# Patient Record
Sex: Female | Born: 1980 | Race: White | Hispanic: No | Marital: Married | Smoking: Current every day smoker
Health system: Southern US, Community
[De-identification: ages and names within clinical notes are randomized; demographics above are authoritative.]

## PROBLEM LIST (undated history)

## (undated) DIAGNOSIS — G43909 Migraine, unspecified, not intractable, without status migrainosus: Secondary | ICD-10-CM

## (undated) DIAGNOSIS — R569 Unspecified convulsions: Secondary | ICD-10-CM

## (undated) HISTORY — PX: ABDOMINAL HYSTERECTOMY: SHX81

---

## 1998-05-19 ENCOUNTER — Inpatient Hospital Stay (HOSPITAL_COMMUNITY): Admission: AD | Admit: 1998-05-19 | Discharge: 1998-05-19 | Payer: Self-pay | Admitting: Obstetrics and Gynecology

## 1998-05-28 ENCOUNTER — Ambulatory Visit (HOSPITAL_COMMUNITY): Admission: RE | Admit: 1998-05-28 | Discharge: 1998-05-28 | Payer: Self-pay | Admitting: Obstetrics & Gynecology

## 1998-11-03 ENCOUNTER — Encounter: Payer: Self-pay | Admitting: Family Medicine

## 1998-11-03 ENCOUNTER — Ambulatory Visit (HOSPITAL_COMMUNITY): Admission: RE | Admit: 1998-11-03 | Discharge: 1998-11-03 | Payer: Self-pay | Admitting: Family Medicine

## 1998-11-05 ENCOUNTER — Other Ambulatory Visit: Admission: RE | Admit: 1998-11-05 | Discharge: 1998-11-05 | Payer: Self-pay | Admitting: Family Medicine

## 1999-02-01 ENCOUNTER — Inpatient Hospital Stay (HOSPITAL_COMMUNITY): Admission: AD | Admit: 1999-02-01 | Discharge: 1999-02-03 | Payer: Self-pay | Admitting: Family Medicine

## 1999-02-02 ENCOUNTER — Encounter: Payer: Self-pay | Admitting: Family Medicine

## 1999-03-29 ENCOUNTER — Inpatient Hospital Stay (HOSPITAL_COMMUNITY): Admission: AD | Admit: 1999-03-29 | Discharge: 1999-03-30 | Payer: Self-pay | Admitting: Family Medicine

## 2000-05-13 ENCOUNTER — Emergency Department (HOSPITAL_COMMUNITY): Admission: EM | Admit: 2000-05-13 | Discharge: 2000-05-13 | Payer: Self-pay | Admitting: Emergency Medicine

## 2000-06-08 ENCOUNTER — Other Ambulatory Visit: Admission: RE | Admit: 2000-06-08 | Discharge: 2000-06-08 | Payer: Self-pay | Admitting: Family Medicine

## 2000-06-14 ENCOUNTER — Ambulatory Visit (HOSPITAL_COMMUNITY): Admission: RE | Admit: 2000-06-14 | Discharge: 2000-06-14 | Payer: Self-pay | Admitting: Family Medicine

## 2000-06-14 ENCOUNTER — Encounter: Payer: Self-pay | Admitting: Family Medicine

## 2000-07-31 ENCOUNTER — Ambulatory Visit (HOSPITAL_COMMUNITY): Admission: RE | Admit: 2000-07-31 | Discharge: 2000-07-31 | Payer: Self-pay | Admitting: Family Medicine

## 2000-07-31 ENCOUNTER — Encounter: Payer: Self-pay | Admitting: Family Medicine

## 2000-10-10 ENCOUNTER — Encounter: Payer: Self-pay | Admitting: Family Medicine

## 2000-10-10 ENCOUNTER — Ambulatory Visit (HOSPITAL_COMMUNITY): Admission: RE | Admit: 2000-10-10 | Discharge: 2000-10-10 | Payer: Self-pay | Admitting: Family Medicine

## 2000-10-23 ENCOUNTER — Inpatient Hospital Stay (HOSPITAL_COMMUNITY): Admission: AD | Admit: 2000-10-23 | Discharge: 2000-10-26 | Payer: Self-pay | Admitting: Family Medicine

## 2000-10-24 ENCOUNTER — Encounter: Payer: Self-pay | Admitting: Family Medicine

## 2000-10-31 ENCOUNTER — Other Ambulatory Visit: Admission: RE | Admit: 2000-10-31 | Discharge: 2000-10-31 | Payer: Self-pay | Admitting: Family Medicine

## 2000-12-02 ENCOUNTER — Inpatient Hospital Stay (HOSPITAL_COMMUNITY): Admission: AD | Admit: 2000-12-02 | Discharge: 2000-12-02 | Payer: Self-pay | Admitting: Family Medicine

## 2000-12-21 ENCOUNTER — Inpatient Hospital Stay (HOSPITAL_COMMUNITY): Admission: AD | Admit: 2000-12-21 | Discharge: 2000-12-21 | Payer: Self-pay | Admitting: *Deleted

## 2000-12-26 ENCOUNTER — Inpatient Hospital Stay (HOSPITAL_COMMUNITY): Admission: AD | Admit: 2000-12-26 | Discharge: 2000-12-27 | Payer: Self-pay | Admitting: *Deleted

## 2001-11-08 ENCOUNTER — Other Ambulatory Visit: Admission: RE | Admit: 2001-11-08 | Discharge: 2001-11-08 | Payer: Self-pay | Admitting: Family Medicine

## 2002-11-01 ENCOUNTER — Ambulatory Visit (HOSPITAL_COMMUNITY): Admission: RE | Admit: 2002-11-01 | Discharge: 2002-11-01 | Payer: Self-pay | Admitting: Family Medicine

## 2002-11-28 ENCOUNTER — Other Ambulatory Visit: Admission: RE | Admit: 2002-11-28 | Discharge: 2002-11-28 | Payer: Self-pay | Admitting: Family Medicine

## 2004-12-30 ENCOUNTER — Emergency Department (HOSPITAL_COMMUNITY): Admission: EM | Admit: 2004-12-30 | Discharge: 2004-12-30 | Payer: Self-pay | Admitting: Emergency Medicine

## 2009-02-14 ENCOUNTER — Emergency Department (HOSPITAL_COMMUNITY): Admission: EM | Admit: 2009-02-14 | Discharge: 2009-02-14 | Payer: Self-pay | Admitting: Emergency Medicine

## 2009-02-18 ENCOUNTER — Other Ambulatory Visit: Admission: RE | Admit: 2009-02-18 | Discharge: 2009-02-18 | Payer: Self-pay | Admitting: Obstetrics & Gynecology

## 2009-03-13 ENCOUNTER — Encounter: Admission: RE | Admit: 2009-03-13 | Discharge: 2009-03-13 | Payer: Self-pay | Admitting: *Deleted

## 2009-03-23 ENCOUNTER — Ambulatory Visit (HOSPITAL_COMMUNITY): Admission: RE | Admit: 2009-03-23 | Discharge: 2009-03-24 | Payer: Self-pay | Admitting: Obstetrics & Gynecology

## 2009-03-23 ENCOUNTER — Encounter: Payer: Self-pay | Admitting: Obstetrics & Gynecology

## 2010-09-04 ENCOUNTER — Emergency Department (HOSPITAL_COMMUNITY): Admission: EM | Admit: 2010-09-04 | Discharge: 2010-09-04 | Payer: Self-pay | Admitting: Emergency Medicine

## 2011-03-30 LAB — CBC
HCT: 33.7 % — ABNORMAL LOW (ref 36.0–46.0)
Hemoglobin: 11.4 g/dL — ABNORMAL LOW (ref 12.0–15.0)
MCHC: 33.4 g/dL (ref 30.0–36.0)
MCV: 92.1 fL (ref 78.0–100.0)
RBC: 4.53 MIL/uL (ref 3.87–5.11)
RDW: 13 % (ref 11.5–15.5)
WBC: 13.7 10*3/uL — ABNORMAL HIGH (ref 4.0–10.5)

## 2011-03-30 LAB — PREGNANCY, URINE: Preg Test, Ur: NEGATIVE

## 2011-03-30 LAB — COMPREHENSIVE METABOLIC PANEL
AST: 17 U/L (ref 0–37)
CO2: 20 mEq/L (ref 19–32)
Calcium: 8.9 mg/dL (ref 8.4–10.5)
Creatinine, Ser: 0.88 mg/dL (ref 0.4–1.2)
GFR calc Af Amer: >60 mL/min (ref >60)
GFR calc non Af Amer: >60 mL/min (ref >60)
Total Protein: 6.3 g/dL (ref 6.0–8.3)

## 2011-03-30 LAB — URINALYSIS, ROUTINE W REFLEX MICROSCOPIC
Bilirubin Urine: NEGATIVE
Hgb urine dipstick: NEGATIVE
Ketones, ur: NEGATIVE mg/dL
Protein, ur: NEGATIVE mg/dL
Specific Gravity, Urine: >1.03 — ABNORMAL HIGH (ref 1.005–1.030)
Urobilinogen, UA: 0.2 mg/dL (ref 0.0–1.0)

## 2011-03-30 LAB — TYPE AND SCREEN
ABO/RH(D): O POS
Antibody Screen: NEGATIVE

## 2011-03-30 LAB — BASIC METABOLIC PANEL
GFR calc non Af Amer: >60 mL/min (ref >60)
Glucose, Bld: 144 mg/dL — ABNORMAL HIGH (ref 70–99)
Potassium: 3.8 mEq/L (ref 3.5–5.1)
Sodium: 139 mEq/L (ref 135–145)

## 2011-03-30 LAB — ABO/RH: ABO/RH(D): O POS

## 2011-04-05 LAB — POCT I-STAT, CHEM 8
Calcium, Ion: 1.19 mmol/L (ref 1.12–1.32)
Chloride: 110 mEq/L (ref 96–112)
Glucose, Bld: 98 mg/dL (ref 70–99)
HCT: 42 % (ref 36.0–46.0)
Hemoglobin: 14.3 g/dL (ref 12.0–15.0)

## 2011-04-05 LAB — CBC
HCT: 40.8 % (ref 36.0–46.0)
Hemoglobin: 13.9 g/dL (ref 12.0–15.0)
MCHC: 34.1 g/dL (ref 30.0–36.0)
MCV: 91.5 fL (ref 78.0–100.0)
RDW: 13.4 % (ref 11.5–15.5)

## 2011-04-05 LAB — WET PREP, GENITAL: Trich, Wet Prep: NONE SEEN

## 2011-04-05 LAB — DIFFERENTIAL
Basophils Absolute: 0.1 10*3/uL (ref 0.0–0.1)
Basophils Relative: 1 % (ref 0–1)
Eosinophils Absolute: 0.3 10*3/uL (ref 0.0–0.7)
Eosinophils Relative: 3 % (ref 0–5)
Monocytes Absolute: 0.7 10*3/uL (ref 0.1–1.0)

## 2011-05-03 NOTE — Discharge Summary (Signed)
[name], [name]              ACCOUNT NO.:  [id number]   MEDICAL RECORD NO.:  [id number]          PATIENT TYPE:  OIB   LOCATION:  9310                          FACILITY:  [facility]   PHYSICIAN:  M. Leda Quail, MD  DATE OF BIRTH:  1981/05/19   DATE OF ADMISSION:  03/23/2009  DATE OF DISCHARGE:                               DISCHARGE SUMMARY   PREOPERATIVE DIAGNOSES:  42. A 30 year old G2, P2 married white female with menorrhagia.  2. Dysmenorrhea.  3. Seizure disorder.  4. Smoker.   DISCHARGE DIAGNOSES:  32. A 30 year old G2, P2 married white female with menorrhagia.  2. Dysmenorrhea.  3. Seizure disorder.  4. Smoker.   PROCEDURE:  During hospitalization, TVH.   HISTORY OF PRESENT ILLNESS:  Written H&P is in the chart, but in brief,  this 30 year old married white female has a history of menorrhagia and  dysmenorrhea, has worsened after starting seizure medicines in the last  year.  She was on a 35 mcg estrogen pill, which had been controlling her  bleeding and her pain fairly well.  We discussed all alternative  options, but she has no desire for future childbearing and was ready to  proceed with definitive management.  Risks and benefits have been  explained to the patient and it is documented in our office chart.   HOSPITAL COURSE:  The patient was admitted on same day surgery.  She was  taken to operating room where [facility] was performed without difficulty.  Initially, a possible LAVH was planned, but because she had good  relaxation under anesthesia, the decision was made to proceed straight  vaginally.  The patient had approximately 100 mL of urine output during  the procedure and minimal blood loss.  From the operating room, she was  taken to the recovery room and after appropriate time there, transferred  to the third floor where she remained for rest of her hospitalization.  She was seen in the evening of postoperative day #0 and she had some  pain issues, but was  otherwise doing well.  She had stable vital signs,  was afebrile, and had been able to void twice.  She was feeling little  bit unsteady on her feet, but did not a catheter replaced.  Overnight,  she did well.  She woke up with some soreness, but her pain was much  improved and her nausea completely resolved.  She had passed flatus and  was voiding well.  She voided multiple times during the evening.  Vital  signs, T-max 98.1, respirations 12-16, pulse 62-88, blood pressure 101-  102/66- 67.  In's and out's, she had taken 470 p.o. and 2750 mL of  urine.  Labs showed a sodium 139, potassium 3.8, BUN and creatinine 4  and 0.6 respectively.  Her white count was 13.7, hemoglobin was 11.4,  platelet count was 231.  Her exam was benign.  Abdomen was soft,  nontender, nondistended with positive bowel sounds.  Lungs were clear to  auscultation bilaterally.  Heart with regular rate and rhythm without  murmurs, rubs, or gallops.  Perineum was dry and extremities showed  no  clubbing, cyanosis, or edema.  At this point, she was doing well with  p.o.'s.  She will be transitioned regular food this morning and also to  regular pain medicine.  Her IV will be discontinued and her PCA will be  removed.  She has been up and ambulating to the bathroom well, so I do  not think she will have any trouble with ambulating in the halls.  She  will be watched this morning.  If she does well with all of these  things, will be discharged to home.   DISCHARGE INSTRUCTIONS:  Provided in the written and verbal form.  She  also has pain medicine including Percocet 5/325 one-two tabs p.o. q.4-6  h. p.r.n. pain and Motrin 800 mg one every 8 hours as needed for pain.   FOLLOWUP:  She will see me in 1 week.  The patient has a followup  appointment that is scheduled.  She has been given reasons to call  including increased vaginal bleeding, fever greater than 100.5 or any  unusual symptoms.  The patient voiced understanding  all of the above.  She will be discharged to home with her family in stable condition.      Lum Keas, MD  Electronically Signed     MSM/MEDQ  D:  03/24/2009  T:  03/24/2009  Job:  [id number]

## 2011-05-03 NOTE — Op Note (Signed)
[name], [name]              ACCOUNT NO.:  [id number]   MEDICAL RECORD NO.:  [id number]          PATIENT TYPE:  OIB   LOCATION:  9310                          FACILITY:  [facility]   PHYSICIAN:  M. Leda Quail, MD  DATE OF BIRTH:  10-21-1981   DATE OF PROCEDURE:  03/23/2009  DATE OF DISCHARGE:                               OPERATIVE REPORT   PREOPERATIVE DIAGNOSES:  29. 30 year old G2, P2 married white female with menorrhagia.  2. Dysmenorrhea.  3. Seizure disorder.  4. Smoker.   POSTOPERATIVE DIAGNOSES:  41. 30 year old G2, P2 married white female with menorrhagia.  2. Dysmenorrhea.  3. Seizure disorder.  4. Smoker.   PROCEDURE:  Total vaginal hysterectomy.   SURGEON:  M. Leda Quail, MD   ASSISTANT:  [name] Felty. Romine, MD   ANESTHESIA:  General endotracheal, Dr. Jean Klein oversaw the case.   FINDINGS:  Small uterus, smooth contour with normal ovaries and tubes.   SPECIMEN:  Uterine cervix to Path.   ESTIMATED BLOOD LOSS:  Minimal.   URINE OUTPUT:  100 mL of clear urine in Foley catheter.   FLUIDS:  1500 mL, 1000 given in the OR, 500 in the PACU.   COMPLICATIONS:  None.   INDICATIONS:  [name] Klein is a 30 year old G2, P2 married white female  who presents with worsening menorrhagia and dysmenorrhea.  She has been  controlled on oral contraceptives, but recently was diagnosed with a  seizure disorder.  She has been evaluated fully for this and is  currently on [name] Klein.  Her cycles have changed significantly since  initiation of the [name] Klein.  She is on the 35-mcg pill with worsening  bleeding and pain issues.  She is missing work because of the pain and  the bleeding around the time of her menstrual cycle.  We did discuss  other options including ablation versus an IUD.  The patient has had 2  children and she was on birth control because she did not want to have  any other children.  She does want to proceed with some sort of surgical  sterility, if she cannot  proceed with definitive management, but  ultimately her request is to proceed with hysterectomy.  We did perform  an ultrasound which did not show any abnormal findings.  She had a  normal Pap this March.  After considering our options, the patient  ultimately decided for definitive management with the hysterectomy.  Risks and benefits were explained to the patient and this is all  documented in our office chart.   PROCEDURE:  The patient presented to the preop area.  Running IV was  placed.  She was taken to the operating room.  She was placed in supine  position.  General endotracheal anesthesia was administered by the  anesthesia staff without difficulty.  Legs positioned in [name] Klein stirrups  in the low-lithotomy position initially.  Exam under anesthesia was  performed and the uterus was mobile as noted in the office, but she had  much more relaxation.  At this point, I felt I could proceed with the  surgery as a straight vaginal instead  of a laparoscopic assisted.  Perineum, inner thighs, and vagina were prepped in a normal sterile  fashion and a Foley catheter was inserted in the bladder under sterile  condition.  The patient was draped in normal sterile fashion.  Legs were  positioned to the high-lithotomy position.  A heavy-weight speculum was  placed in the posterior aspect of the vagina.  The cervix was grasped  with [name] Klein tenaculum.  10 mL of 1% Xylocaine mixed 1:1 with  epinephrine (1:100,000 units) was instilled in the cervix.  10 mL were  instilled total.  Posterior cul-de-sac was entered sharply.  Posterior  vaginal mucosa and peritoneum were attached with figure-of-eight suture.  A [name] Klein speculum was placed through this incision.  The uterosacral  ligaments were clamped on each side with [name] Klein clamps.  These pedicles  were incised and suture ligated with [name] Klein stitches of #0 [name] Klein.  Then, the anterior vaginal mucosa was incised sharply with the knife  down to the  level of cervix.  The pubovesical cervical fascia was  incised using curved [name] Klein scissors.  The operator's index finger could  be easily placed in the plane between the bladder and the cervix.  The  curved [name] Klein retractor was then used to help lift the bladder out of  the way.  At this point, the anterior cul-de-sac was entered sharply.  The [name] Klein retractor was placed through this incision.  Then, the  cardinal ligaments were serially clamped, transected, and suture ligated  with #0 [name] Klein.  The uterine arteries then were clamped bilaterally,  suture ligated with 2 stitches of #0 [name] Klein.  Then, the broad ligament  was serially clamped, transected, and suture ligated as necessary until  the utero-ovarian pedicle was present.  At this point, the uterus was  flipped, delivered through the vaginal opening.  The utero-ovarian  pedicle was traversed with [name] Klein clamps bilaterally.  The pedicles were  then cut.  The uterus and cervix were delivered through the vaginal  opening.  The utero-ovarian pedicle was then suture ligated first with a  free tie of #0 [name] Klein, then a stitch tie of #0 [name] Klein.  This was done  bilaterally.  At this point, all pedicles were inspected.  No bleeding  was noted except along the posterior cuff.  The utero-ovarian pedicles  were cut.  The ovaries did appear normal.  These retracted superiorly in  abdomen.  A moistened lap tape was placed to push bowel out of the way.  The cuff was then run with a long 2-0 [name] Klein starting at the 2 o'clock  position.  The anterior vaginal mucosa and peritoneum were incorporated  in a stitch.  A running interlocking stitch was performed.  This was  brought around the cervix and ended at 10 o'clock position.   An enterocele stitch was then obtained.  The 2-0 [name] Klein on an SH needle  was obtained.  The stitch was brought to the posterior vaginal mucosa  and posterior peritoneum.  The medial third of the left uterosacral  ligament was  incorporated in the stitch.  The posterior vaginal mucosa  was incorporated and that was reefed across in the medial third, the  right uterosacral ligament was included.  The stitch was brought back to  the posterior vaginal cuff and stitch was tied tightly.  The lap tape  was removed at this point.  No bowel was in the incision.  Stitch was  then closed with 3 figure-of-eight sutures of #0 [name] Klein.  The vagina was  irrigated.  No bleeding was noted.  All instruments were removed.  The  patient tolerated the procedure well.  The Foley bulb was removed.  Her  legs were  positioned back in the supine position.  The Betadine prep was cleansed  off her skin.  No Toradol was given because she has an allergy to  ASPIRIN.  Sponge, lap, needle, instrument counts were correct x2.  The  patient was awakened from anesthesia, extubated, and taken to recovery  in stable addition.      Lum Keas, MD  Electronically Signed     MSM/MEDQ  D:  03/23/2009  T:  03/23/2009  Job:  [id number]   cc:   Rhona Leavens, PA-C
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
## 2012-11-07 ENCOUNTER — Emergency Department (HOSPITAL_COMMUNITY): Payer: Self-pay

## 2012-11-07 ENCOUNTER — Encounter (HOSPITAL_COMMUNITY): Payer: Self-pay | Admitting: *Deleted

## 2012-11-07 ENCOUNTER — Emergency Department (HOSPITAL_COMMUNITY)
Admission: EM | Admit: 2012-11-07 | Discharge: 2012-11-07 | Disposition: A | Discharge status: Home / Self Care | Payer: Self-pay | Attending: Emergency Medicine | Admitting: Emergency Medicine

## 2012-11-07 DIAGNOSIS — F17200 Nicotine dependence, unspecified, uncomplicated: Secondary | ICD-10-CM | POA: Insufficient documentation

## 2012-11-07 DIAGNOSIS — G43909 Migraine, unspecified, not intractable, without status migrainosus: Secondary | ICD-10-CM

## 2012-11-07 DIAGNOSIS — R112 Nausea with vomiting, unspecified: Secondary | ICD-10-CM | POA: Insufficient documentation

## 2012-11-07 DIAGNOSIS — R569 Unspecified convulsions: Secondary | ICD-10-CM

## 2012-11-07 DIAGNOSIS — H53149 Visual discomfort, unspecified: Secondary | ICD-10-CM | POA: Insufficient documentation

## 2012-11-07 HISTORY — DX: Unspecified convulsions: R56.9

## 2012-11-07 HISTORY — DX: Migraine, unspecified, not intractable, without status migrainosus: G43.909

## 2012-11-07 MED ORDER — LORAZEPAM 2 MG/ML IJ SOLN
2.0000 mg | Freq: Once | INTRAMUSCULAR | Status: AC
  Administered 2012-11-07: 2 mg via INTRAVENOUS
  Filled 2012-11-07: qty 1

## 2012-11-07 MED ORDER — ONDANSETRON HCL 4 MG/2ML IJ SOLN
4.0000 mg | Freq: Once | INTRAMUSCULAR | Status: AC
  Administered 2012-11-07: 4 mg via INTRAVENOUS
  Filled 2012-11-07: qty 2

## 2012-11-07 MED ORDER — HYDROMORPHONE HCL PF 1 MG/ML IJ SOLN
1.0000 mg | Freq: Once | INTRAMUSCULAR | Status: AC
  Administered 2012-11-07: 1 mg via INTRAVENOUS
  Filled 2012-11-07: qty 1

## 2012-11-07 NOTE — ED Provider Notes (Signed)
History     CSN: [id number]  Arrival date & time 11/07/12  2025   First MD Initiated Contact with Patient 11/07/12 2046      Chief Complaint  Patient presents with  . Headache    (Consider location/radiation/quality/duration/timing/severity/associated sxs/prior treatment) HPI Comments: Awakened today with typical [facility] sxs.  Vomited x 2.  Light-sensitive.  States she had a seizure ~ 1800 today.  Not witness.  States she knows how feel after a seizure.  Duration and other specifics unknown.  H/o seizures since ~ age 31.  Was on lamictal but has not taken ~ 2-3 years "because i don't have insurance and can't afford to see dr. Anne Hahn or to pay for the medicine".  Patient is a 31 y.o. female presenting with headaches. The history is provided by the patient. No language interpreter was used.  Headache  This is a chronic problem. Episode onset: this AM. The problem occurs constantly. The problem has not changed since onset.The headache is associated with nothing. The pain is located in the right unilateral and temporal region. The quality of the pain is described as throbbing. The pain is moderate. The pain does not radiate. Associated symptoms include nausea and vomiting. Pertinent negatives include no fever. She has tried nothing for the symptoms.    Past Medical History  Diagnosis Date  . Seizures   . Migraine     History reviewed. No pertinent past surgical history.  History reviewed. No pertinent family history.  History  Substance Use Topics  . Smoking status: Current Every Day Smoker  . Smokeless tobacco: Not on file  . Alcohol Use: No    OB History    Grav Para Term Preterm Abortions TAB SAB Ect Mult Living                  Review of Systems  Constitutional: Negative for fever and chills.  HENT: Negative for neck pain.   Eyes: Positive for photophobia. Negative for visual disturbance.  Gastrointestinal: Positive for nausea and vomiting.  Neurological: Positive for  seizures and headaches.  Psychiatric/Behavioral: Negative for confusion and decreased concentration.  All other systems reviewed and are negative.    Allergies  Aspirin; Codeine; and Sulfa antibiotics  Home Medications   Current Outpatient Rx  Name  Route  Sig  Dispense  Refill  . ACETAMINOPHEN 500 MG PO TABS   Oral   Take 500-1,000 mg by mouth every 6 (six) hours as needed. For pain           BP 98/60  Pulse 68  Temp 97.3 F (36.3 C) (Oral)  Resp 20  Ht 5\' 2"  (1.575 m)  Wt 104 lb (47.174 kg)  BMI 19.02 kg/m2  SpO2 96%  LMP 10/14/2012  Physical Exam  Nursing note and vitals reviewed. Constitutional: She is oriented to person, place, and time. She appears well-developed and well-nourished. No distress.  HENT:  Head: Normocephalic and atraumatic.  Eyes: EOM are normal. Pupils are equal, round, and reactive to light.  Neck: Normal range of motion.  Cardiovascular: Normal rate and regular rhythm.   Pulmonary/Chest: Effort normal and breath sounds normal. No respiratory distress.  Abdominal: Soft. She exhibits no distension. There is no tenderness.  Musculoskeletal: Normal range of motion.  Neurological: She is alert and oriented to person, place, and time. She displays normal reflexes. No cranial nerve deficit or sensory deficit. Coordination and gait normal. GCS eye subscore is 4. GCS verbal subscore is 5. GCS motor subscore is  6.  Reflex Scores:      Tricep reflexes are 2+ on the right side and 2+ on the left side.      Bicep reflexes are 2+ on the right side and 2+ on the left side.      Brachioradialis reflexes are 2+ on the right side and 2+ on the left side.      Patellar reflexes are 2+ on the right side and 2+ on the left side.      Achilles reflexes are 2+ on the right side and 2+ on the left side. Skin: Skin is warm and dry.  Psychiatric: She has a normal mood and affect. Judgment normal.    ED Course  Procedures (including critical care time)  Labs  Reviewed - No data to display Ct Head Wo Contrast  11/07/2012  *RADIOLOGY REPORT*  Clinical Data: Severe right temporal headache, reported seizure, nausea, vomiting  CT HEAD WITHOUT CONTRAST  Technique:  Contiguous axial images were obtained from the base of the skull through the vertex without contrast.  Comparison: None  Findings: Normal ventricular morphology. No midline shift or mass effect. Normal appearance of brain parenchyma. No intracranial hemorrhage, mass lesion, or acute infarction. Visualized paranasal sinuses and mastoid air cells clear. Bones unremarkable.  IMPRESSION: No acute intracranial abnormalities.   Original Report Authenticated By: Ulyses Southward, M.D.      1. Migraine   2. Seizure       MDM  Call your PCP and dr. Anne Hahn to see if they can offer any suggestions to help you afford your lamictal.        Evalina Field, PA 11/07/12 2303

## 2012-11-07 NOTE — ED Notes (Signed)
Pt reports seizure lasting a "few seconds" this morning - states her jaw locked up, and this was the extent of her seizure activity.  States has hx of seizures, last seizure 2 years ago.  States was on Lamictal for same, last took 2 years ago.  Reports awoke with right-sided HA this morning, and has had n/v with the pain.  States has hx of migraine HA.

## 2012-11-07 NOTE — ED Notes (Signed)
Discharge instructions reviewed with pt, questions answered. Pt verbalized understanding.  

## 2012-11-07 NOTE — ED Notes (Signed)
Headache, all day, with vomitng,Had a sz today also,  Has been 2 years since last sz,  No med for sz

## 2012-11-09 NOTE — ED Provider Notes (Signed)
Medical screening examination/treatment/procedure(s) were performed by non-physician practitioner and as supervising physician I was immediately available for consultation/collaboration.  Jowanna Loeffler T Sabin Gibeault, MD 11/09/12 0915 

## 2017-11-16 ENCOUNTER — Encounter (HOSPITAL_COMMUNITY): Payer: Self-pay | Admitting: Emergency Medicine

## 2017-11-16 ENCOUNTER — Emergency Department (HOSPITAL_COMMUNITY): Payer: Self-pay

## 2017-11-16 ENCOUNTER — Telehealth: Payer: Self-pay

## 2017-11-16 ENCOUNTER — Other Ambulatory Visit: Payer: Self-pay

## 2017-11-16 ENCOUNTER — Emergency Department (HOSPITAL_COMMUNITY)
Admission: EM | Admit: 2017-11-16 | Discharge: 2017-11-16 | Disposition: A | Discharge status: Home / Self Care | Payer: Self-pay | Attending: Emergency Medicine | Admitting: Emergency Medicine

## 2017-11-16 DIAGNOSIS — Z79899 Other long term (current) drug therapy: Secondary | ICD-10-CM | POA: Insufficient documentation

## 2017-11-16 DIAGNOSIS — R69 Illness, unspecified: Secondary | ICD-10-CM | POA: Insufficient documentation

## 2017-11-16 DIAGNOSIS — R51 Headache: Secondary | ICD-10-CM | POA: Insufficient documentation

## 2017-11-16 DIAGNOSIS — R110 Nausea: Secondary | ICD-10-CM | POA: Insufficient documentation

## 2017-11-16 DIAGNOSIS — R531 Weakness: Secondary | ICD-10-CM | POA: Insufficient documentation

## 2017-11-16 DIAGNOSIS — F17210 Nicotine dependence, cigarettes, uncomplicated: Secondary | ICD-10-CM | POA: Insufficient documentation

## 2017-11-16 DIAGNOSIS — J111 Influenza due to unidentified influenza virus with other respiratory manifestations: Secondary | ICD-10-CM

## 2017-11-16 DIAGNOSIS — R0981 Nasal congestion: Secondary | ICD-10-CM | POA: Insufficient documentation

## 2017-11-16 DIAGNOSIS — R202 Paresthesia of skin: Secondary | ICD-10-CM

## 2017-11-16 DIAGNOSIS — R05 Cough: Secondary | ICD-10-CM | POA: Insufficient documentation

## 2017-11-16 DIAGNOSIS — R5383 Other fatigue: Secondary | ICD-10-CM | POA: Insufficient documentation

## 2017-11-16 DIAGNOSIS — M7918 Myalgia, other site: Secondary | ICD-10-CM | POA: Insufficient documentation

## 2017-11-16 LAB — CBC WITH DIFFERENTIAL/PLATELET
BASOS ABS: 0.1 10*3/uL (ref 0.0–0.1)
BASOS PCT: 1 %
Eosinophils Absolute: 0.3 10*3/uL (ref 0.0–0.7)
Eosinophils Relative: 4 %
HEMATOCRIT: 45.1 % (ref 36.0–46.0)
HEMOGLOBIN: 14.7 g/dL (ref 12.0–15.0)
Lymphocytes Relative: 34 %
Lymphs Abs: 2 10*3/uL (ref 0.7–4.0)
MCH: 30.1 pg (ref 26.0–34.0)
MCHC: 32.6 g/dL (ref 30.0–36.0)
MCV: 92.2 fL (ref 78.0–100.0)
MONO ABS: 0.5 10*3/uL (ref 0.1–1.0)
Monocytes Relative: 8 %
NEUTROS ABS: 3.1 10*3/uL (ref 1.7–7.7)
NEUTROS PCT: 53 %
Platelets: 305 10*3/uL (ref 150–400)
RBC: 4.89 MIL/uL (ref 3.87–5.11)
RDW: 13.3 % (ref 11.5–15.5)
WBC: 6 10*3/uL (ref 4.0–10.5)

## 2017-11-16 LAB — BASIC METABOLIC PANEL
ANION GAP: 7 (ref 5–15)
BUN: 10 mg/dL (ref 6–20)
CALCIUM: 9.1 mg/dL (ref 8.9–10.3)
CO2: 25 mmol/L (ref 22–32)
Chloride: 106 mmol/L (ref 101–111)
Creatinine, Ser: 0.77 mg/dL (ref 0.44–1.00)
Glucose, Bld: 82 mg/dL (ref 65–99)
Potassium: 3.9 mmol/L (ref 3.5–5.1)
Sodium: 138 mmol/L (ref 135–145)

## 2017-11-16 LAB — URINALYSIS, ROUTINE W REFLEX MICROSCOPIC
Bilirubin Urine: NEGATIVE
Glucose, UA: NEGATIVE mg/dL
Hgb urine dipstick: NEGATIVE
KETONES UR: NEGATIVE mg/dL
LEUKOCYTES UA: NEGATIVE
NITRITE: NEGATIVE
PH: 5 (ref 5.0–8.0)
Protein, ur: NEGATIVE mg/dL
Specific Gravity, Urine: 1.005 (ref 1.005–1.030)

## 2017-11-16 LAB — POC URINE PREG, ED: Preg Test, Ur: NEGATIVE

## 2017-11-16 LAB — TSH: TSH: 5.238 u[IU]/mL — ABNORMAL HIGH (ref 0.350–4.500)

## 2017-11-16 MED ORDER — ACETAMINOPHEN 325 MG PO TABS
650.0000 mg | ORAL_TABLET | Freq: Once | ORAL | Status: AC
  Administered 2017-11-16: 650 mg via ORAL
  Filled 2017-11-16: qty 2

## 2017-11-16 MED ORDER — BENZONATATE 100 MG PO CAPS
100.0000 mg | ORAL_CAPSULE | Freq: Three times a day (TID) | ORAL | 0 refills | Status: DC
Start: 2017-11-16 — End: 2021-07-13

## 2017-11-16 MED ORDER — NAPROXEN 375 MG PO TABS: 375.0000 mg | ORAL_TABLET | Freq: Two times a day (BID) | ORAL | 0 refills | Status: DC

## 2017-11-16 NOTE — ED Notes (Signed)
Pt to CT

## 2017-11-16 NOTE — Discharge Instructions (Signed)
Drink plenty of fluids and rest.  Call the doctors listed to arrange a follow-up appt.  Your thyroid level today could indicate that your thyroid is not functioning properly, so call Dr. Fransico HimNida to schedule an appt for further evaluation.

## 2017-11-16 NOTE — Telephone Encounter (Signed)
error 

## 2017-11-16 NOTE — ED Triage Notes (Signed)
bodyaches since yesterday at work. Took tylenol. Slight nausea but denies v/d. Weakness and generalized malaise per pt.

## 2017-11-17 NOTE — ED Provider Notes (Signed)
[facility] EMERGENCY DEPARTMENT Provider Note   CSN: [id number] Arrival date & time: 11/16/17  4098     History   Chief Complaint Chief Complaint  Patient presents with  . Generalized Body Aches    HPI Rosaleigh Brazzel is a 36 y.o. female.  HPI   Sevanna Ballengee is a 36 y.o. female who presents to the Emergency Department complaining of waxing and waning pins and needles ascending sensation from her bilateral feet up to both hands.  Symptoms have been persisting for several months.  No injury.  She also complains of generalized body aches and weakness, frontal headache, fatigue, nausea, cough and nasal congestion for one day.  Reports sick contacts at work.  Denies fever, chest pain, shortness of breath, neck pain and stiffness, and dysuria.    Past Medical History:  Diagnosis Date  . Migraine   . Seizures (HCC)     There are no active problems to display for this patient.   History reviewed. No pertinent surgical history.  OB History    Gravida Para Term Preterm AB Living   1             SAB TAB Ectopic Multiple Live Births                   Home Medications    Prior to Admission medications   Medication Sig Start Date End Date Taking? Authorizing Provider  acetaminophen (TYLENOL) 500 MG tablet Take 500-1,000 mg by mouth every 6 (six) hours as needed. For pain   Yes [provider]  VITAMIN E PO Take 1 tablet by mouth daily.   Yes [provider]  benzonatate (TESSALON) 100 MG capsule Take 1 capsule (100 mg total) by mouth every 8 (eight) hours. 11/16/17   Schae Cando, PA-C  naproxen (NAPROSYN) 375 MG tablet Take 1 tablet (375 mg total) by mouth 2 (two) times daily with a meal. 11/16/17   Briele Lagasse, PA-C    Family History History reviewed. No pertinent family history.  Social History Social History   Tobacco Use  . Smoking status: Current Every Day Smoker    Packs/day: 1.00    Types: Cigarettes  . Smokeless tobacco: Never Used    Substance Use Topics  . Alcohol use: No  . Drug use: No     Allergies   Aspirin; Codeine; and Sulfa antibiotics   Review of Systems Review of Systems  Constitutional: Positive for fatigue. Negative for activity change, appetite change, chills and fever.  HENT: Positive for congestion. Negative for facial swelling, rhinorrhea, sore throat and trouble swallowing.   Eyes: Negative for visual disturbance.  Respiratory: Positive for cough. Negative for chest tightness, shortness of breath, wheezing and stridor.   Cardiovascular: Negative for chest pain and palpitations.  Gastrointestinal: Positive for nausea. Negative for abdominal pain and vomiting.  Endocrine: Negative for polydipsia and polyuria.  Genitourinary: Negative for dysuria.  Musculoskeletal: Positive for myalgias. Negative for arthralgias, neck pain and neck stiffness.  Skin: Negative for rash.  Neurological: Positive for weakness and headaches. Negative for dizziness, syncope, speech difficulty and numbness.  Hematological: Negative for adenopathy.  Psychiatric/Behavioral: Negative for confusion.  All other systems reviewed and are negative.    Physical Exam Updated Vital Signs BP 101/62 (BP Location: Left Arm)   Pulse 83   Temp 98 F (36.7 C) (Oral)   Resp 14   Ht 5\' 1"  (1.549 m)   Wt 45.4 kg (100 lb)   LMP 10/30/2017  SpO2 100%   Breastfeeding? Unknown   BMI 18.89 kg/m   Physical Exam  Constitutional: She is oriented to person, place, and time. She appears well-developed and well-nourished. No distress.  HENT:  Head: Atraumatic.  Mouth/Throat: Oropharynx is clear and moist.  Eyes: Conjunctivae and EOM are normal. Pupils are equal, round, and reactive to light.  Neck: Normal range of motion and phonation normal. No Kernig's sign noted.  Cardiovascular: Normal rate, regular rhythm and intact distal pulses.  No murmur heard. Pulmonary/Chest: Effort normal. No respiratory distress. She exhibits no  tenderness.  Abdominal: Soft. She exhibits no distension. There is no tenderness.  Musculoskeletal: Normal range of motion.  Neurological: She is alert and oriented to person, place, and time. She has normal strength. No sensory deficit. Coordination and gait normal. GCS eye subscore is 4. GCS verbal subscore is 5. GCS motor subscore is 6.  CN II-XII intact. Speech clear, no facial weaknessno pronator drift, 5/5 strength of bilateral upper and lower extremities.  Skin: Skin is warm. Capillary refill takes less than 2 seconds.  Psychiatric: She has a normal mood and affect.  Vitals reviewed.    ED Treatments / Results  Labs (all labs ordered are listed, but only abnormal results are displayed) Labs Reviewed  URINALYSIS, ROUTINE W REFLEX MICROSCOPIC - Abnormal; Notable for the following components:      Result Value   Color, Urine STRAW (*)    All other components within normal limits  TSH - Abnormal; Notable for the following components:   TSH 5.238 (*)    All other components within normal limits  BASIC METABOLIC PANEL  CBC WITH DIFFERENTIAL/PLATELET  POC URINE PREG, ED    EKG  EKG Interpretation None       Radiology Dg Chest 2 View  Result Date: 11/16/2017 CLINICAL DATA:  Body aches.  Cough. EXAM: CHEST  2 VIEW COMPARISON:  No recent prior . FINDINGS: Mediastinum and hilar structures normal the lungs are clear. No pleural effusion or pneumothorax. Heart size normal. No acute bony abnormality . IMPRESSION: No acute abnormality. Electronically Signed   By: Maisie Fushomas  Register   On: 11/16/2017 10:43   Ct Head Wo Contrast  Result Date: 11/16/2017 CLINICAL DATA:  Tingling in the hands and toes with dizziness. No reported injury. EXAM: CT HEAD WITHOUT CONTRAST TECHNIQUE: Contiguous axial images were obtained from the base of the skull through the vertex without intravenous contrast. COMPARISON:  11/07/2012 head CT. FINDINGS: Brain: No evidence of parenchymal hemorrhage or  extra-axial fluid collection. No mass lesion, mass effect, or midline shift. No CT evidence of acute infarction. Cerebral volume is age appropriate. No ventriculomegaly. Vascular: No acute abnormality. Skull: No evidence of calvarial fracture. Sinuses/Orbits: The visualized paranasal sinuses are essentially clear. Other:  The mastoid air cells are unopacified. IMPRESSION: Negative head CT.  No evidence of acute intracranial abnormality. Electronically Signed   By: Delbert PhenixJason A Poff M.D.   On: 11/16/2017 12:51    Procedures Procedures (including critical care time)  Medications Ordered in ED Medications  acetaminophen (TYLENOL) tablet 650 mg (650 mg Oral Given 11/16/17 1308)     Initial Impression / Assessment and Plan / ED Course  I have reviewed the triage vital signs and the nursing notes.  Pertinent labs & imaging results that were available during my care of the patient were reviewed by me and considered in my medical decision making (see chart for details).     Pt is non-toxic appearing.  Vitals stable.  No  meningeal signs.  No focal neuro deficits.  CT head reassuring, TSH is elevated.  Paresthesias possibly related to hypothyroidism.   Pt appears stable for d/c, will give referral to endocrinology.  Return precautions discussed.    Final Clinical Impressions(s) / ED Diagnoses   Final diagnoses:  Influenza-like illness  Paresthesias    ED Discharge Orders        Ordered    benzonatate (TESSALON) 100 MG capsule  Every 8 hours     11/16/17 1320    naproxen (NAPROSYN) 375 MG tablet  2 times daily with meals     11/16/17 1320       Pauline Ausriplett, Alamin Mccuiston, PA-C 11/17/17 2054    Linwood DibblesKnapp, Jon, MD 11/18/17 [phone]

## 2017-12-28 ENCOUNTER — Other Ambulatory Visit: Payer: Self-pay | Admitting: Neurology

## 2017-12-28 DIAGNOSIS — R269 Unspecified abnormalities of gait and mobility: Secondary | ICD-10-CM

## 2018-01-18 ENCOUNTER — Ambulatory Visit (HOSPITAL_COMMUNITY)
Admission: RE | Admit: 2018-01-18 | Discharge: 2018-01-18 | Disposition: A | Discharge status: Home / Self Care | Payer: BLUE CROSS/BLUE SHIELD | Source: Ambulatory Visit | Attending: Neurology | Admitting: Neurology

## 2018-01-18 DIAGNOSIS — R2689 Other abnormalities of gait and mobility: Secondary | ICD-10-CM | POA: Diagnosis present

## 2018-01-18 DIAGNOSIS — R269 Unspecified abnormalities of gait and mobility: Secondary | ICD-10-CM

## 2018-01-18 DIAGNOSIS — R9089 Other abnormal findings on diagnostic imaging of central nervous system: Secondary | ICD-10-CM | POA: Insufficient documentation

## 2018-04-12 ENCOUNTER — Other Ambulatory Visit: Payer: Self-pay | Admitting: Neurology

## 2018-04-20 ENCOUNTER — Other Ambulatory Visit: Payer: Self-pay | Admitting: Neurology

## 2018-04-20 DIAGNOSIS — G8190 Hemiplegia, unspecified affecting unspecified side: Secondary | ICD-10-CM

## 2018-04-20 DIAGNOSIS — R202 Paresthesia of skin: Secondary | ICD-10-CM

## 2018-04-20 DIAGNOSIS — M5412 Radiculopathy, cervical region: Secondary | ICD-10-CM

## 2018-04-20 DIAGNOSIS — M542 Cervicalgia: Secondary | ICD-10-CM

## 2018-04-20 DIAGNOSIS — R200 Anesthesia of skin: Secondary | ICD-10-CM

## 2018-04-27 ENCOUNTER — Ambulatory Visit (HOSPITAL_COMMUNITY): Admission: RE | Admit: 2018-04-27 | Payer: BLUE CROSS/BLUE SHIELD | Source: Ambulatory Visit

## 2018-05-15 ENCOUNTER — Ambulatory Visit (HOSPITAL_COMMUNITY)
Admission: RE | Admit: 2018-05-15 | Discharge: 2018-05-15 | Disposition: A | Discharge status: Home / Self Care | Payer: BLUE CROSS/BLUE SHIELD | Source: Ambulatory Visit | Attending: Neurology | Admitting: Neurology

## 2018-05-15 DIAGNOSIS — M8938 Hypertrophy of bone, other site: Secondary | ICD-10-CM | POA: Insufficient documentation

## 2018-05-15 DIAGNOSIS — M5412 Radiculopathy, cervical region: Secondary | ICD-10-CM

## 2018-05-15 DIAGNOSIS — M542 Cervicalgia: Secondary | ICD-10-CM | POA: Insufficient documentation

## 2018-05-15 DIAGNOSIS — R202 Paresthesia of skin: Secondary | ICD-10-CM | POA: Insufficient documentation

## 2018-05-15 DIAGNOSIS — R200 Anesthesia of skin: Secondary | ICD-10-CM

## 2018-05-15 DIAGNOSIS — M4802 Spinal stenosis, cervical region: Secondary | ICD-10-CM | POA: Insufficient documentation

## 2018-05-15 DIAGNOSIS — G8190 Hemiplegia, unspecified affecting unspecified side: Secondary | ICD-10-CM | POA: Diagnosis present

## 2018-05-15 DIAGNOSIS — M4722 Other spondylosis with radiculopathy, cervical region: Secondary | ICD-10-CM | POA: Insufficient documentation

## 2018-05-23 ENCOUNTER — Other Ambulatory Visit: Payer: Self-pay | Admitting: Neurology

## 2018-05-23 DIAGNOSIS — M5412 Radiculopathy, cervical region: Secondary | ICD-10-CM

## 2018-06-05 ENCOUNTER — Ambulatory Visit
Admission: RE | Admit: 2018-06-05 | Discharge: 2018-06-05 | Disposition: A | Discharge status: Home / Self Care | Payer: BLUE CROSS/BLUE SHIELD | Source: Ambulatory Visit | Attending: Neurology | Admitting: Neurology

## 2018-06-05 DIAGNOSIS — M5412 Radiculopathy, cervical region: Secondary | ICD-10-CM

## 2018-06-05 MED ORDER — TRIAMCINOLONE ACETONIDE 40 MG/ML IJ SUSP (RADIOLOGY)
60.0000 mg | Freq: Once | INTRAMUSCULAR | Status: AC
  Administered 2018-06-05: 60 mg via EPIDURAL

## 2018-06-05 MED ORDER — IOPAMIDOL (ISOVUE-M 300) INJECTION 61%
1.0000 mL | Freq: Once | INTRAMUSCULAR | Status: AC | PRN
  Administered 2018-06-05: 1 mL via EPIDURAL

## 2018-06-05 NOTE — Discharge Instructions (Signed)

## 2019-10-18 ENCOUNTER — Emergency Department (HOSPITAL_COMMUNITY)
Admission: EM | Admit: 2019-10-18 | Discharge: 2019-10-18 | Disposition: A | Discharge status: Home / Self Care | Payer: Self-pay | Attending: Emergency Medicine | Admitting: Emergency Medicine

## 2019-10-18 ENCOUNTER — Other Ambulatory Visit: Payer: Self-pay

## 2019-10-18 ENCOUNTER — Emergency Department (HOSPITAL_COMMUNITY): Payer: Self-pay

## 2019-10-18 ENCOUNTER — Encounter (HOSPITAL_COMMUNITY): Payer: Self-pay

## 2019-10-18 DIAGNOSIS — W108XXA Fall (on) (from) other stairs and steps, initial encounter: Secondary | ICD-10-CM | POA: Insufficient documentation

## 2019-10-18 DIAGNOSIS — F17210 Nicotine dependence, cigarettes, uncomplicated: Secondary | ICD-10-CM | POA: Insufficient documentation

## 2019-10-18 DIAGNOSIS — Y939 Activity, unspecified: Secondary | ICD-10-CM | POA: Insufficient documentation

## 2019-10-18 DIAGNOSIS — S93601A Unspecified sprain of right foot, initial encounter: Secondary | ICD-10-CM | POA: Insufficient documentation

## 2019-10-18 DIAGNOSIS — Y929 Unspecified place or not applicable: Secondary | ICD-10-CM | POA: Insufficient documentation

## 2019-10-18 DIAGNOSIS — Y999 Unspecified external cause status: Secondary | ICD-10-CM | POA: Insufficient documentation

## 2019-10-18 DIAGNOSIS — Z79899 Other long term (current) drug therapy: Secondary | ICD-10-CM | POA: Insufficient documentation

## 2019-10-18 DIAGNOSIS — S93401A Sprain of unspecified ligament of right ankle, initial encounter: Secondary | ICD-10-CM | POA: Insufficient documentation

## 2019-10-18 MED ORDER — ACETAMINOPHEN 500 MG PO TABS: 1000.0000 mg | ORAL_TABLET | Freq: Once | ORAL | Status: DC

## 2019-10-18 NOTE — Discharge Instructions (Signed)
Please leave the splint in place until Monday.  Please use your crutches until you can safely apply weight to the right lower extremity.  Please apply ice, and elevate your foot above your waist when sitting and above your heart when lying down.  Use Tylenol every 4 hours as needed for pain.  Please see Dr. Aline Brochure for orthopedic evaluation and management if this is not improving.

## 2019-10-18 NOTE — ED Provider Notes (Signed)
[facility] EMERGENCY DEPARTMENT Provider Note   CSN: [id number] Arrival date & time: 10/18/19  1435     History   Chief Complaint Chief Complaint  Patient presents with  . Foot Pain    HPI [name] Klein is a 38 y.o. female.     Pt fell down steps today and injured the right foot and ankle. No head, chest, abd, or pelvis injury.  The history is provided by the patient.  Foot Pain This is a new problem. The current episode started 3 to 5 hours ago. The problem occurs constantly. The problem has been gradually worsening. Pertinent negatives include no chest pain, no abdominal pain and no shortness of breath. The symptoms are aggravated by walking and standing. Nothing relieves the symptoms. She has tried acetaminophen and rest for the symptoms.    Past Medical History:  Diagnosis Date  . Migraine   . Seizures (HCC)     There are no active problems to display for this patient.   Past Surgical History:  Procedure Laterality Date  . ABDOMINAL HYSTERECTOMY       OB History    Gravida  1   Para      Term      Preterm      AB      Living        SAB      TAB      Ectopic      Multiple      Live Births               Home Medications    Prior to Admission medications   Medication Sig Start Date End Date Taking? Authorizing Provider  acetaminophen (TYLENOL) 500 MG tablet Take 500-1,000 mg by mouth every 6 (six) hours as needed. For pain    [provider]  benzonatate (TESSALON) 100 MG capsule Take 1 capsule (100 mg total) by mouth every 8 (eight) hours. 11/16/17   Triplett, Tammy, PA-C  cyclobenzaprine (FLEXERIL) 5 MG tablet Take by mouth. 12/05/17   [provider]  gabapentin (NEURONTIN) 300 MG capsule Take 300 mg by mouth every 6 (six) hours. 02/05/18   [provider]  meloxicam (MOBIC) 7.5 MG tablet Take by mouth. 12/05/17   [provider]  Vitamin D, Ergocalciferol, (DRISDOL) 50000 units CAPS capsule   02/05/18   [provider]  VITAMIN E PO Take 1 tablet by mouth daily.    [provider]    Family History No family history on file.  Social History Social History   Tobacco Use  . Smoking status: Current Every Day Smoker    Packs/day: 1.00    Types: Cigarettes  . Smokeless tobacco: Never Used  Substance Use Topics  . Alcohol use: No  . Drug use: No     Allergies   Aspirin, Codeine, Hydrocodone, and Sulfa antibiotics   Review of Systems Review of Systems  Constitutional: Negative for activity change and appetite change.  HENT: Negative for congestion, ear discharge, ear pain, facial swelling, nosebleeds, rhinorrhea, sneezing and tinnitus.   Eyes: Negative for photophobia, pain and discharge.  Respiratory: Negative for cough, choking, shortness of breath and wheezing.   Cardiovascular: Negative for chest pain, palpitations and leg swelling.  Gastrointestinal: Negative for abdominal pain, blood in stool, constipation, diarrhea, nausea and vomiting.  Genitourinary: Negative for difficulty urinating, dysuria, flank pain, frequency and hematuria.  Musculoskeletal: Positive for arthralgias. Negative for back pain, gait problem, myalgias and neck pain.  Skin: Negative for color change, rash and wound.  Neurological: Negative for dizziness, seizures, syncope, facial asymmetry, speech difficulty, weakness and numbness.  Hematological: Negative for adenopathy. Does not bruise/bleed easily.  Psychiatric/Behavioral: Negative for agitation, confusion, hallucinations, self-injury and suicidal ideas. The patient is not nervous/anxious.      Physical Exam Updated Vital Signs BP 114/88 (BP Location: Right Arm)   Pulse (!) 104   Temp 98.1 F (36.7 C) (Oral)   Resp 18   Ht 5\' 1"  (1.549 m)   Wt 54.4 kg   LMP 10/30/2017   SpO2 97%   BMI 22.67 kg/m   Physical Exam Vitals signs and nursing note reviewed.  Constitutional:      Appearance: She is well-developed.  She is not toxic-appearing.  HENT:     Head: Normocephalic.     Right Ear: Tympanic membrane and external ear normal.     Left Ear: Tympanic membrane and external ear normal.  Eyes:     General: Lids are normal.     Pupils: Pupils are equal, round, and reactive to light.  Neck:     Musculoskeletal: Normal range of motion and neck supple.     Vascular: No carotid bruit.  Cardiovascular:     Rate and Rhythm: Normal rate and regular rhythm.     Pulses: Normal pulses.     Heart sounds: Normal heart sounds.  Pulmonary:     Effort: No respiratory distress.     Breath sounds: Normal breath sounds.  Abdominal:     General: Bowel sounds are normal.     Palpations: Abdomen is soft.     Tenderness: There is no abdominal tenderness. There is no guarding.  Musculoskeletal:        General: Tenderness and signs of injury present. No deformity.     Right ankle: She exhibits decreased range of motion. Tenderness. Lateral malleolus tenderness found. Achilles tendon normal.     Right foot: Normal capillary refill. Tenderness present. No deformity.       Feet:     Comments: There is no pain to movement or jostling of the pelvis.  There is no pain or deformity of the right or left thigh.  No pain of the right or left knee.  There is soreness of the anterior tibial area on the right compared to the left, but no palpable deformity.  There is pain even to light touch from the toes to the ankle dorsally.  The patient has movement of the toes.  The Achilles tendon is intact.  The dorsalis pedis pulse is 2+.  There is no break of the skin or puncture wound noted of the plantar surface.  Lymphadenopathy:     Head:     Right side of head: No submandibular adenopathy.     Left side of head: No submandibular adenopathy.     Cervical: No cervical adenopathy.  Skin:    General: Skin is warm and dry.  Neurological:     Mental Status: She is alert and oriented to person, place, and time.     Cranial Nerves: No  cranial nerve deficit.     Sensory: No sensory deficit.  Psychiatric:        Speech: Speech normal.      ED Treatments / Results  Labs (all labs ordered are listed, but only abnormal results are displayed) Labs Reviewed - No data to display  EKG None  Radiology Dg Ankle Complete Right  Result Date: 10/18/2019 CLINICAL DATA:  Fall downstairs, pain, twisting injury EXAM: RIGHT ANKLE - COMPLETE 3+ VIEW COMPARISON:  10/18/2019 FINDINGS: There is no evidence of fracture, dislocation, or joint effusion. There is no evidence of arthropathy or other focal bone abnormality. Soft tissues are unremarkable. IMPRESSION: Negative. Electronically Signed   By: Judie PetitM.  Shick M.D.   On: 10/18/2019 15:58   Dg Foot Complete Right  Result Date: 10/18/2019 CLINICAL DATA:  Fall downstairs, twisting injury, pain EXAM: RIGHT FOOT COMPLETE - 3+ VIEW COMPARISON:  10/18/2019 FINDINGS: There is no evidence of fracture or dislocation. There is no evidence of arthropathy or other focal bone abnormality. Soft tissues are unremarkable. IMPRESSION: Negative. Electronically Signed   By: Judie PetitM.  Shick M.D.   On: 10/18/2019 15:58    Procedures .Splint Application  Date/Time: 10/18/2019 5:07 PM Performed by: Ivery QualeBryant, Jamelle Noy, PA-C Authorized by: Ivery QualeBryant, Aylanie Cubillos, PA-C   Consent:    Consent obtained:  Verbal   Consent given by:  Patient   Risks discussed:  Numbness, pain and swelling   Alternatives discussed:  No treatment Universal protocol:    Procedure explained and questions answered to patient or proxy's satisfaction: yes     Immediately prior to procedure a time out was called: yes     Patient identity confirmed:  Verbally with patient Pre-procedure details:    Sensation:  Normal   Skin color:  Normal Procedure details:    Laterality:  Right   Location:  Ankle (right foot and ankle)   Ankle:  R ankle   Splint type: Watson-Jones.   Supplies:  Cotton padding and elastic bandage (post op shoe) Post-procedure  details:    Pain:  Unchanged   Sensation:  Normal   Skin color:  Normal   Patient tolerance of procedure:  Tolerated well, no immediate complications   (including critical care time)  Medications Ordered in ED Medications - No data to display   Initial Impression / Assessment and Plan / ED Course  I have reviewed the triage vital signs and the nursing notes.  Pertinent labs & imaging results that were available during my care of the patient were reviewed by me and considered in my medical decision making (see chart for details).          Final Clinical Impressions(s) / ED Diagnoses MDM  Patient reports she fell down some stairs earlier this morning.  The pain has been getting progressively worse so she came to the emergency department for additional evaluation.  No neurovascular deficits appreciated of the lower extremities.  X-ray of the right foot is negative for fracture or dislocation.  X-ray of the right ankle is also negative for fracture or dislocation.  The Achilles tendon is intact.  The patient was fitted with a Watson-Jones splint.  Ice pack supplied, postoperative shoe supplied, and crutches supplied.  The patient says she has several allergies to medications, and she will use Tylenol for her discomfort.  I have asked the patient to see the orthopedic specialist if this is not improving.   Final diagnoses:  Sprain of right foot, initial encounter  Sprain of right ankle, unspecified ligament, initial encounter  Fall (on) (from) other stairs and steps, initial encounter    ED Discharge Orders    None       Ivery QualeBryant, Jaquitta Dupriest, PA-C 10/19/19 1217    Gerhard MunchLockwood, Robert, MD 10/20/19 [phone]

## 2019-10-18 NOTE — ED Triage Notes (Signed)
Pt presents to ED with complaints of right foot and ankle pain after falling down steps today. Pt denies LOC and hitting head.

## 2019-10-31 ENCOUNTER — Telehealth: Payer: Self-pay | Admitting: Orthopedic Surgery

## 2019-10-31 NOTE — Telephone Encounter (Signed)
The instructions from 10/18/19 Emergency visit: The patient was fitted with a Watson-Jones splint.  Ice pack supplied, postoperative shoe supplied, and crutches supplied.  The patient says she has several allergies to medications, and she will use Tylenol for her discomfort.  I have asked the patient to see the orthopedic specialist if this is not improving.  Patient was scheduled as of 10/21/19 for the appointment 11/01/19.  She states the "cotton in the wrap" is kind of coming apart, but she is wearing the shoe and using the crutches. Aware we will need to re-schedule in-person appointment when she is symptom free.   If any other advice regarding wrap, please advise.

## 2019-10-31 NOTE — Telephone Encounter (Signed)
Sounds good

## 2019-10-31 NOTE — Telephone Encounter (Signed)
Patient called regarding her appointment with Dr Aline Brochure for tomorrow, 11/01/19 - Emergency room follow up for Right foot/ankle, twisting injury. States unable to come because she is now "very sick."  States has appointment with primary care doctor for virtual visit today. Aware needs to cancel for tomorrow's in-person visit; however, has questions about whether to remove bandage, etc.  Can we schedule as a virtual?  If so, later than 11:00?

## 2019-10-31 NOTE — Telephone Encounter (Signed)
1. I can t say I ve not seen her follow er instructions   Twisting injury ??? No fracture ??? Doesn't need appt tomorrow

## 2019-10-31 NOTE — Telephone Encounter (Signed)
Patient aware.

## 2019-11-01 ENCOUNTER — Ambulatory Visit: Payer: Self-pay | Admitting: Orthopedic Surgery

## 2021-03-04 ENCOUNTER — Encounter: Payer: Self-pay | Admitting: Orthopaedic Surgery

## 2021-03-18 ENCOUNTER — Ambulatory Visit (INDEPENDENT_AMBULATORY_CARE_PROVIDER_SITE_OTHER): Payer: Self-pay | Admitting: Orthopaedic Surgery

## 2021-03-18 ENCOUNTER — Ambulatory Visit: Payer: Self-pay

## 2021-03-18 ENCOUNTER — Encounter: Payer: Self-pay | Admitting: Orthopaedic Surgery

## 2021-03-18 ENCOUNTER — Other Ambulatory Visit: Payer: Self-pay

## 2021-03-18 VITALS — BP 140/80 | HR 60 | Ht 61.0 in | Wt 137.4 lb

## 2021-03-18 DIAGNOSIS — G8929 Other chronic pain: Secondary | ICD-10-CM

## 2021-03-18 DIAGNOSIS — F17210 Nicotine dependence, cigarettes, uncomplicated: Secondary | ICD-10-CM

## 2021-03-18 DIAGNOSIS — M25561 Pain in right knee: Secondary | ICD-10-CM

## 2021-03-18 NOTE — Progress Notes (Signed)
Subjective:    Patient ID: [name] Klein, female    DOB: September 07, 1981, 40 y.o.   MRN: [id number]  HPI She has long history of bilateral knee pain, more on the right.  She has seen Dr. Gerilyn Pilgrim for this. I have reviewed the notes. She has no trauma.  She has swelling and popping of the right knee with giving way recently.  She has fallen several times. She has no trauma, no redness, no weakness. Nothing seems to help it.   Review of Systems  Constitutional: Positive for activity change.  Musculoskeletal: Positive for arthralgias, gait problem and joint swelling.  Neurological: Positive for seizures and headaches.  All other systems reviewed and are negative.  For Review of Systems, all other systems reviewed and are negative.  The following is a summary of the past history medically, past history surgically, known current medicines, social history and family history.  This information is gathered electronically by the computer from prior information and documentation.  I review this each visit and have found including this information at this point in the chart is beneficial and informative.   Past Medical History:  Diagnosis Date  . Migraine   . Seizures (HCC)     Past Surgical History:  Procedure Laterality Date  . ABDOMINAL HYSTERECTOMY      Current Outpatient Medications on File Prior to Visit  Medication Sig Dispense Refill  . amitriptyline (ELAVIL) 25 MG tablet Take 25 mg by mouth at bedtime.    Marland Kitchen acetaminophen (TYLENOL) 500 MG tablet Take 500-1,000 mg by mouth every 6 (six) hours as needed. For pain    . benzonatate (TESSALON) 100 MG capsule Take 1 capsule (100 mg total) by mouth every 8 (eight) hours. 21 capsule 0  . cyclobenzaprine (FLEXERIL) 5 MG tablet Take by mouth.    . gabapentin (NEURONTIN) 300 MG capsule Take 300 mg by mouth every 6 (six) hours.    . meloxicam (MOBIC) 7.5 MG tablet Take by mouth.    . Vitamin D, Ergocalciferol, (DRISDOL) 50000 units CAPS capsule      . VITAMIN E PO Take 1 tablet by mouth daily.     No current facility-administered medications on file prior to visit.    Social History   Socioeconomic History  . Marital status: Married    Spouse name: Not on file  . Number of children: Not on file  . Years of education: Not on file  . Highest education level: Not on file  Occupational History  . Not on file  Tobacco Use  . Smoking status: Current Every Day Smoker    Packs/day: 1.00    Types: Cigarettes  . Smokeless tobacco: Never Used  Vaping Use  . Vaping Use: Never used  Substance and Sexual Activity  . Alcohol use: No  . Drug use: No  . Sexual activity: Yes    Birth control/protection: None  Other Topics Concern  . Not on file  Social History Narrative  . Not on file   Social Determinants of Health   Financial Resource Strain: Not on file  Food Insecurity: Not on file  Transportation Needs: Not on file  Physical Activity: Not on file  Stress: Not on file  Social Connections: Not on file  Intimate Partner Violence: Not on file    Family History  Problem Relation Age of Onset  . Diabetes Mother   . Diabetes Maternal Aunt   . Diabetes Maternal Grandmother     BP 140/80   Pulse  60   Ht 5\' 1"  (1.549 m)   Wt 137 lb 6 oz (62.3 kg)   LMP 10/30/2017   BMI 25.96 kg/m   Body mass index is 25.96 kg/m.      Objective:   Physical Exam Vitals and nursing note reviewed. Exam conducted with a chaperone present.  Constitutional:      Appearance: She is well-developed.  HENT:     Head: Normocephalic and atraumatic.  Eyes:     Conjunctiva/sclera: Conjunctivae normal.     Pupils: Pupils are equal, round, and reactive to light.  Cardiovascular:     Rate and Rhythm: Normal rate and regular rhythm.  Pulmonary:     Effort: Pulmonary effort is normal.  Abdominal:     Palpations: Abdomen is soft.  Musculoskeletal:     Cervical back: Normal range of motion and neck supple.       Legs:  Skin:    General:  Skin is warm and dry.  Neurological:     Mental Status: She is alert and oriented to person, place, and time.     Cranial Nerves: No cranial nerve deficit.     Motor: No abnormal muscle tone.     Coordination: Coordination normal.     Deep Tendon Reflexes: Reflexes are normal and symmetric. Reflexes normal.  Psychiatric:        Behavior: Behavior normal.        Thought Content: Thought content normal.        Judgment: Judgment normal.    X-rays were done of the right knee, reported separately.       Assessment & Plan:   Encounter Diagnoses  Name Primary?  . Chronic pain of right knee Yes  . Nicotine dependence, cigarettes, uncomplicated    I am concerned she has a medial meniscus tear.  I would like to get a MRI but she has no insurance.  I will have her contact [facility].  PROCEDURE NOTE:  The patient requests injections of the right knee , verbal consent was obtained.  The right knee was prepped appropriately after time out was performed.   Sterile technique was observed and injection of 1 cc of Celestone 6 mg with several cc's of plain xylocaine. Anesthesia was provided by ethyl chloride and a 20-gauge needle was used to inject the knee area. The injection was tolerated well.  A band aid dressing was applied.  The patient was advised to apply ice later today and tomorrow to the injection sight as needed.  Return in two weeks.  Call if any problem.  Precautions discussed.   Electronically Signed 13/11/2017, MD 3/31/202211:09 AM

## 2021-03-18 NOTE — Patient Instructions (Signed)

## 2021-04-01 ENCOUNTER — Other Ambulatory Visit: Payer: Self-pay

## 2021-04-01 ENCOUNTER — Encounter: Payer: Self-pay | Admitting: Orthopaedic Surgery

## 2021-04-01 ENCOUNTER — Ambulatory Visit (INDEPENDENT_AMBULATORY_CARE_PROVIDER_SITE_OTHER): Payer: Self-pay | Admitting: Orthopaedic Surgery

## 2021-04-01 VITALS — BP 121/80 | HR 89 | Ht 61.0 in | Wt 137.0 lb

## 2021-04-01 DIAGNOSIS — M25561 Pain in right knee: Secondary | ICD-10-CM

## 2021-04-01 DIAGNOSIS — G8929 Other chronic pain: Secondary | ICD-10-CM

## 2021-04-01 DIAGNOSIS — F17210 Nicotine dependence, cigarettes, uncomplicated: Secondary | ICD-10-CM

## 2021-04-01 NOTE — Progress Notes (Signed)
My knee is worse.  Her knee is more painful on the right.  She has giving way and swelling.  The injection did not help.  I told her she needs a MRI as I suspect a meniscus tear, but she has no insurance. I have told her about [facility].  Encounter Diagnoses  Name Primary?  . Chronic pain of right knee Yes  . Nicotine dependence, cigarettes, uncomplicated    Try to apply to [employer].  Keep on present medicine.  Return in two weeks.  Call if any problem.  Precautions discussed.   Electronically Signed Darreld Mclean, MD 4/14/20229:15 AM

## 2021-04-08 ENCOUNTER — Telehealth: Payer: Self-pay | Admitting: Orthopaedic Surgery

## 2021-04-08 NOTE — Telephone Encounter (Signed)
Spoke with patient and she is going to speak with the guy about the discount and see if she needs to have the MRI as a bill to get financial help or what and get back with me tomorrow.

## 2021-04-08 NOTE — Telephone Encounter (Signed)
Patient called to ask if her MRI was being ordered. Per last visit note, [facility] financial discount is pending. I placed an account note for billing/finance office as to what patient understood the process to be, which is to have the MRI done through [facility] for a bill, then try to apply for Medicaid, and if turned down by Medicaid, to then apply for the discount. Patient also mentioned another option discussed per Dr Hilda Lias regarding a site where patients can self-pay for an amount of $675.00, which she said would be manageable.  I relayed that Dr Janae Bridgeman want to re-evaluate and further discuss at her appointment scheduled for next week, 04/15/21. Please advise.

## 2021-04-15 ENCOUNTER — Other Ambulatory Visit: Payer: Self-pay

## 2021-04-15 ENCOUNTER — Encounter: Payer: Self-pay | Admitting: Orthopaedic Surgery

## 2021-04-15 ENCOUNTER — Ambulatory Visit (INDEPENDENT_AMBULATORY_CARE_PROVIDER_SITE_OTHER): Payer: Self-pay | Admitting: Orthopaedic Surgery

## 2021-04-15 VITALS — BP 130/76 | HR 76 | Ht 61.0 in | Wt 137.0 lb

## 2021-04-15 DIAGNOSIS — F17210 Nicotine dependence, cigarettes, uncomplicated: Secondary | ICD-10-CM

## 2021-04-15 DIAGNOSIS — G8929 Other chronic pain: Secondary | ICD-10-CM

## 2021-04-15 DIAGNOSIS — M25561 Pain in right knee: Secondary | ICD-10-CM

## 2021-04-15 NOTE — Progress Notes (Signed)
I am still applying for [facility]  She has locking of the knee more and more on the right now.  She needs MRI.  She is in process of getting [facility].  NV intact.  Limp right.  Positive medial McMurray.  Encounter Diagnoses  Name Primary?  . Chronic pain of right knee Yes  . Nicotine dependence, cigarettes, uncomplicated    Return in three weeks or earlier. Get MRI as soon as [facility] approved.  Call if any problem.  Precautions discussed.   Electronically Signed Darreld Mclean, MD 4/28/20229:34 AM

## 2021-04-27 ENCOUNTER — Telehealth: Payer: Self-pay | Admitting: Orthopaedic Surgery

## 2021-04-27 NOTE — Telephone Encounter (Signed)
[name] Klein called and said that she got a letter from [facility] denying her coverage.  She said that she was to let us know and that we are to go ahead and schedule her an MRI.  She is still working with one Public relations account executive.  Can you please take care of this for her?  Thanks

## 2021-04-30 NOTE — Telephone Encounter (Signed)
Patient called back to relay that financial counselor advised her to have the MRI done, in order to have a bill for the financial application. Question is - does Dr Hilda Lias wish to go ahead and order the MRI, or does patient need to come to the 3-week follow up appointment scheduled for 05/06/21 prior to ordering.  Again, patient said she is still currently under self-pay until approved for any further discount beyond the self-pay discount per [facility].

## 2021-04-30 NOTE — Telephone Encounter (Signed)
I attempted to call patient back and was unable to leave a message.

## 2021-05-04 NOTE — Telephone Encounter (Signed)
Patient missed call if Kathie Rhodes returned the call - please call back to patient regarding whether MRI can be ordered, rather than patient coming to follow up appointment scheduled, as she has not yet been approved for financial discount; said must have services done in order to apply for the [facility] discount.

## 2021-05-05 ENCOUNTER — Other Ambulatory Visit: Payer: Self-pay | Admitting: Orthopaedic Surgery

## 2021-05-05 DIAGNOSIS — G8929 Other chronic pain: Secondary | ICD-10-CM

## 2021-05-05 NOTE — Telephone Encounter (Signed)
I have spoken with [name] Klein and she is aware that the order has been put in for her MRI

## 2021-05-06 ENCOUNTER — Ambulatory Visit: Payer: Self-pay | Admitting: Orthopaedic Surgery

## 2021-05-25 ENCOUNTER — Ambulatory Visit (HOSPITAL_COMMUNITY)
Admission: RE | Admit: 2021-05-25 | Discharge: 2021-05-25 | Disposition: A | Discharge status: Home / Self Care | Payer: Self-pay | Source: Ambulatory Visit | Attending: Orthopaedic Surgery | Admitting: Orthopaedic Surgery

## 2021-05-25 ENCOUNTER — Other Ambulatory Visit: Payer: Self-pay

## 2021-05-25 DIAGNOSIS — G8929 Other chronic pain: Secondary | ICD-10-CM | POA: Insufficient documentation

## 2021-05-25 DIAGNOSIS — M25561 Pain in right knee: Secondary | ICD-10-CM | POA: Insufficient documentation

## 2021-05-27 ENCOUNTER — Other Ambulatory Visit: Payer: Self-pay

## 2021-05-27 ENCOUNTER — Ambulatory Visit (INDEPENDENT_AMBULATORY_CARE_PROVIDER_SITE_OTHER): Payer: Self-pay | Admitting: Orthopaedic Surgery

## 2021-05-27 ENCOUNTER — Encounter: Payer: Self-pay | Admitting: Orthopaedic Surgery

## 2021-05-27 VITALS — Ht 61.0 in | Wt 136.0 lb

## 2021-05-27 DIAGNOSIS — M25561 Pain in right knee: Secondary | ICD-10-CM

## 2021-05-27 DIAGNOSIS — F17210 Nicotine dependence, cigarettes, uncomplicated: Secondary | ICD-10-CM

## 2021-05-27 DIAGNOSIS — G8929 Other chronic pain: Secondary | ICD-10-CM

## 2021-05-27 MED ORDER — DICLOFENAC SODIUM 75 MG PO TBEC: 75.0000 mg | DELAYED_RELEASE_TABLET | Freq: Two times a day (BID) | ORAL | 2 refills | Status: DC

## 2021-05-27 NOTE — Progress Notes (Signed)
My knee still hurts.  Her right knee is painful but she has no swelling.  She has pain after being up on it a while.  She has no new trauma.  She had MRI of the right knee showing:  IMPRESSION: 1. Focal partial-thickness cartilage defect of the medial femoral condyle measuring up to 6 mm. 2. Intact menisci. Intact cruciate and collateral ligaments.   I have explained the findings to her.  I would not recommend surgery.  We need to try to find NSAID that will work for her.  She has tried Mobic and Naprosyn.  I have independently reviewed the MRI.    I will begin diclofenac.  Encounter Diagnoses  Name Primary?   Chronic pain of right knee Yes   Nicotine dependence, cigarettes, uncomplicated    Return in five weeks.  Call if any problem.  Precautions discussed.  Electronically Signed Darreld Mclean, MD 6/9/202211:28 AM

## 2021-06-01 ENCOUNTER — Telehealth: Payer: Self-pay | Admitting: Orthopaedic Surgery

## 2021-06-01 MED ORDER — PREDNISONE 10 MG (21) PO TBPK: ORAL_TABLET | ORAL | 1 refills | Status: DC

## 2021-06-01 NOTE — Telephone Encounter (Signed)
Left brief message for patient to call back about her medication refills.

## 2021-06-01 NOTE — Telephone Encounter (Signed)
Patient called and states the medicine you called in for her last week has Asprin in it and she is highly allergic to it.    Please call in something else for her please.  She said the pharmacy caught it  .  Pharmacy : [facility] in [place]

## 2021-06-22 ENCOUNTER — Emergency Department (HOSPITAL_COMMUNITY)
Admission: EM | Admit: 2021-06-22 | Discharge: 2021-06-22 | Disposition: A | Discharge status: Home / Self Care | Payer: Self-pay | Attending: Emergency Medicine | Admitting: Emergency Medicine

## 2021-06-22 ENCOUNTER — Encounter (HOSPITAL_COMMUNITY): Payer: Self-pay | Admitting: *Deleted

## 2021-06-22 ENCOUNTER — Other Ambulatory Visit: Payer: Self-pay

## 2021-06-22 DIAGNOSIS — F17210 Nicotine dependence, cigarettes, uncomplicated: Secondary | ICD-10-CM | POA: Insufficient documentation

## 2021-06-22 DIAGNOSIS — M542 Cervicalgia: Secondary | ICD-10-CM | POA: Insufficient documentation

## 2021-06-22 DIAGNOSIS — J029 Acute pharyngitis, unspecified: Secondary | ICD-10-CM | POA: Insufficient documentation

## 2021-06-22 MED ORDER — LIDOCAINE VISCOUS HCL 2 % MT SOLN
15.0000 mL | Freq: Once | OROMUCOSAL | Status: AC
Start: 2021-06-22 — End: 2021-06-22
  Administered 2021-06-22: 15:00:00 15 mL via ORAL
  Filled 2021-06-22: qty 15

## 2021-06-22 MED ORDER — ALUM & MAG HYDROXIDE-SIMETH 200-200-20 MG/5ML PO SUSP
30.0000 mL | Freq: Once | ORAL | Status: AC
  Administered 2021-06-22: 15:00:00 30 mL via ORAL
  Filled 2021-06-22: qty 30

## 2021-06-22 MED ORDER — NYSTATIN 100000 UNIT/ML MT SUSP: 5.0000 mL | Freq: Three times a day (TID) | OROMUCOSAL | 1 refills | Status: AC | PRN

## 2021-06-22 MED ORDER — IBUPROFEN 400 MG PO TABS
600.0000 mg | ORAL_TABLET | Freq: Once | ORAL | Status: AC
  Administered 2021-06-22: 15:00:00 600 mg via ORAL
  Filled 2021-06-22: qty 2

## 2021-06-22 NOTE — ED Provider Notes (Signed)
[name] EMERGENCY DEPARTMENT Provider Note   CSN: [id number] Arrival date & time: 06/22/21  1325     History Chief Complaint  Patient presents with   Sore Throat    [name] Klein is a 40 y.o. female.  HPI Patient is a 40 year old female with past medical history for seasonal allergies, migraines, seizures  She is presented with rates of persistent sore throat she states that it is achy constant worse with swallowing she states that it is 5 or 6/10 typically.  She states no mitigating factors.  She has not been taking any medications other than gargling warm salt water.  She states this helped somewhat she states that initially it was quite sharp but now seems to be more achy.  Denies any other aggravating mitigating factors.  She does have some mild cough which he states is dry.  She states she is more often clearing her throat anything else.  No other associated symptoms specifically no congestion.  No voice changes.  No hoarse voice.  No difficulty swallowing or difficulty opening her mouth.  She denies any history of immunosuppressive disease.  She denies any difficulty managing her secretions.  States she is still able to eat and drink normally.     Past Medical History:  Diagnosis Date   Migraine    Seizures (HCC)     There are no problems to display for this patient.   Past Surgical History:  Procedure Laterality Date   ABDOMINAL HYSTERECTOMY       OB History     Gravida  1   Para      Term      Preterm      AB      Living         SAB      IAB      Ectopic      Multiple      Live Births              Family History  Problem Relation Age of Onset   Diabetes Mother    Diabetes Maternal Aunt    Diabetes Maternal Grandmother     Social History   Tobacco Use   Smoking status: Every Day    Packs/day: 1.00    Pack years: 0.00    Types: Cigarettes   Smokeless tobacco: Never  Vaping Use   Vaping Use: Never used  Substance Use  Topics   Alcohol use: No   Drug use: No    Home Medications Prior to Admission medications   Medication Sig Start Date End Date Taking? Authorizing Provider  magic mouthwash (nystatin, diphenhydrAMINE, alum & mag hydroxide) suspension mixture Swish and swallow 5 mLs 3 (three) times daily as needed for mouth pain. 1:1:1:1 nystatin, benadryl, maalox, visc lidocaine 06/22/21  Yes Clemmie Buelna, [place] S, PA  acetaminophen (TYLENOL) 500 MG tablet Take 500-1,000 mg by mouth every 6 (six) hours as needed. For pain    [provider]  amitriptyline (ELAVIL) 25 MG tablet Take 25 mg by mouth at bedtime.    [provider]  benzonatate (TESSALON) 100 MG capsule Take 1 capsule (100 mg total) by mouth every 8 (eight) hours. Patient not taking: No sig reported 11/16/17   Triplett, Tammy, PA-C  cyclobenzaprine (FLEXERIL) 5 MG tablet Take by mouth. 12/05/17   [provider]  gabapentin (NEURONTIN) 300 MG capsule Take 300 mg by mouth every 6 (six) hours. 02/05/18   [provider]  predniSONE Albesa Seen  UNI-PAK 21 TAB) 10 MG (21) TBPK tablet Take six pills the first day;5 pills the next day;4 pills the next day;3 pills the next day; 2 pills the next day,one the final day. 06/01/21   Darreld Mclean, MD  Vitamin D, Ergocalciferol, (DRISDOL) 50000 units CAPS capsule  02/05/18   [provider]  VITAMIN E PO Take 1 tablet by mouth daily.    [provider]    Allergies    Aspirin, Codeine, Hydrocodone, and Sulfa antibiotics  Review of Systems   Review of Systems  Constitutional:  Negative for fever.  HENT:  Positive for congestion and sore throat. Negative for trouble swallowing and voice change.   Respiratory:  Negative for shortness of breath.   Cardiovascular:  Negative for chest pain.  Gastrointestinal:  Negative for abdominal distention.  Neurological:  Negative for dizziness and headaches.   Physical Exam Updated Vital Signs BP 106/76 (BP Location: Right  Arm)   Pulse 94   Temp 98.3 F (36.8 C) (Oral)   Resp 16   Ht 5\' 1"  (1.549 m)   Wt 61.7 kg   LMP 10/30/2017   SpO2 97%   BMI 25.70 kg/m   Physical Exam Vitals and nursing note reviewed.  Constitutional:      General: She is not in acute distress.    Appearance: Normal appearance. She is not ill-appearing.     Comments: Is no acute distress.  40 year old female, pleasant, able answer questions properly following commands and managing secretions without difficulty.  HENT:     Head: Normocephalic and atraumatic.     Mouth/Throat:     Mouth: Mucous membranes are moist.     Comments: Posterior pharynx with some posterior pharynx erythema, some clear nasal drainage/postnasal drainage. Eyes:     General: No scleral icterus.       Right eye: No discharge.        Left eye: No discharge.     Conjunctiva/sclera: Conjunctivae normal.  Neck:     Comments: Right sided anterior cervical lymphadenopathy.  Some tenderness to palpation of the anterior neck. Pulmonary:     Effort: Pulmonary effort is normal.     Breath sounds: No stridor.  Musculoskeletal:     Cervical back: Neck supple. Tenderness present. No rigidity.  Lymphadenopathy:     Cervical: Cervical adenopathy present.  Neurological:     Mental Status: She is alert and oriented to person, place, and time. Mental status is at baseline.    ED Results / Procedures / Treatments   Labs (all labs ordered are listed, but only abnormal results are displayed) Labs Reviewed - No data to display  EKG None  Radiology No results found.  Procedures Procedures   Medications Ordered in ED Medications  ibuprofen (ADVIL) tablet 600 mg (600 mg Oral Given 06/22/21 1443)  alum & mag hydroxide-simeth (MAALOX/MYLANTA) 200-200-20 MG/5ML suspension 30 mL (30 mLs Oral Given 06/22/21 1443)    And  lidocaine (XYLOCAINE) 2 % viscous mouth solution 15 mL (15 mLs Oral Given 06/22/21 1443)    ED Course  I have reviewed the triage vital signs and  the nursing notes.  Pertinent labs & imaging results that were available during my care of the patient were reviewed by me and considered in my medical decision making (see chart for details).    MDM Rules/Calculators/A&P                          Patient  is well-appearing 40 year old female presented today with sore throat for 3 weeks.  Overall she is nontoxic-appearing is managing secretions uvula is midline she has posterior pharynx erythema with some posterior nasal drainage overall is well-appearing and does not have a physical exam consistent with PTA/RPA. Low suspicion for Ludwick's angina she has no dental pain no sublingual tenderness palpation  Notable however she is having some neck tenderness.  I offered CT scan and we had a shared decision-making conversation.  Patient has elected to go follow-up with PCP at this time we will treat conservatively with ibuprofen and Magic mouthwash.  Return precautions given.  Final Clinical Impression(s) / ED Diagnoses Final diagnoses:  Pharyngitis, unspecified etiology    Rx / DC Orders ED Discharge Orders          Ordered    magic mouthwash (nystatin, diphenhydrAMINE, alum & mag hydroxide) suspension mixture  3 times daily PRN        06/22/21 1442             Gailen Shelter, [place] 06/22/21 1527    Derwood Kaplan, MD 06/23/21 [phone]

## 2021-06-22 NOTE — ED Notes (Signed)
ED Provider at bedside. 

## 2021-06-22 NOTE — ED Triage Notes (Signed)
Sore throat x 3 weeks

## 2021-06-22 NOTE — Discharge Instructions (Addendum)
Please follow-up closely with your primary care provider.  Please use Nettie pot or similar product to rinse out your sinuses continue to gargle warm salt water you may use Magic mouthwash solution as needed 3 times a day for pain.  Warm tea with honey is also helpful.  Please use Tylenol or ibuprofen for pain.  You may use 600 mg ibuprofen every 6 hours or 1000 mg of Tylenol every 6 hours.  You may choose to alternate between the 2.  This would be most effective.  Not to exceed 4 g of Tylenol within 24 hours.  Not to exceed 3200 mg ibuprofen 24 hours.

## 2021-07-01 ENCOUNTER — Ambulatory Visit: Payer: Self-pay | Admitting: Orthopaedic Surgery

## 2021-07-01 ENCOUNTER — Encounter: Payer: Self-pay | Admitting: Orthopaedic Surgery

## 2021-07-13 ENCOUNTER — Ambulatory Visit (INDEPENDENT_AMBULATORY_CARE_PROVIDER_SITE_OTHER): Payer: Self-pay | Admitting: Orthopaedic Surgery

## 2021-07-13 ENCOUNTER — Other Ambulatory Visit: Payer: Self-pay

## 2021-07-13 ENCOUNTER — Encounter: Payer: Self-pay | Admitting: Orthopaedic Surgery

## 2021-07-13 VITALS — BP 118/76 | HR 94 | Ht 62.0 in | Wt 130.6 lb

## 2021-07-13 DIAGNOSIS — M25561 Pain in right knee: Secondary | ICD-10-CM

## 2021-07-13 DIAGNOSIS — G8929 Other chronic pain: Secondary | ICD-10-CM

## 2021-07-13 DIAGNOSIS — F17210 Nicotine dependence, cigarettes, uncomplicated: Secondary | ICD-10-CM

## 2021-07-13 NOTE — Patient Instructions (Signed)
Return to clinic for second opinion with Dr. Romeo Apple.

## 2021-07-13 NOTE — Progress Notes (Signed)
My knee hurts more.  She has continued pain of the right knee with no giving way and some swelling.  She has medial pain and popping.  MRI done recently showed focal partial thickness cartilage defect of medial femoral condyle measuring up to 6 mm but no meniscus or ligamentous tear.  She has had injection.  She has been on Mobic, Naprosyn and diclofenac with little help.  She continues to hurt.  She has slight effusion and tightness of hamstrings.  She has limp to the right.  NV intact.  Knee is stable on the right.  Encounter Diagnoses  Name Primary?   Chronic pain of right knee Yes   Nicotine dependence, cigarettes, uncomplicated    I will have Dr. Romeo Apple see her for another opinion and see what he has to recommend.  She is agreeable to this.  Call if any problem.  Precautions discussed.  Electronically Signed Darreld Mclean, MD 7/26/20223:20 PM

## 2021-07-21 ENCOUNTER — Encounter: Payer: Self-pay | Admitting: Orthopedic Surgery

## 2021-07-21 ENCOUNTER — Ambulatory Visit: Payer: Self-pay | Admitting: Orthopedic Surgery

## 2021-12-15 IMAGING — MR MR KNEE*R* W/O CM
7 series · 40 of 40 positions shown · non-contrast
Comparison: X-ray 03/18/2021

CLINICAL DATA: Right knee pain.  No known injury.

EXAM:
MRI OF THE RIGHT KNEE WITHOUT CONTRAST
TECHNIQUE: Multiplanar, multisequence MR imaging of the knee was performed. No
intravenous contrast was administered.

[Series 8: T2 fat-sat · axial · right · 4.0mm · 0.47mm/px · z∈[-65,+60]mm · 6 of 26 slices shown (1 of 3)]
[im 1/26]
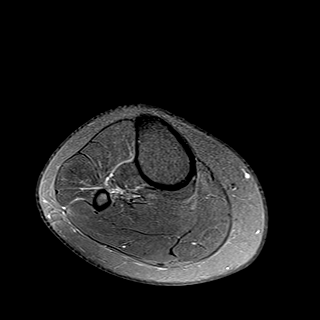
[im 6/26]
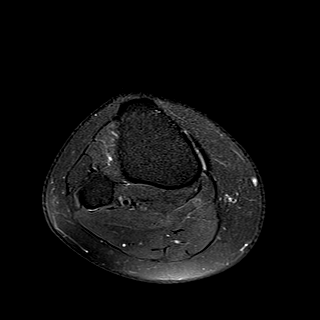
[im 11/26]
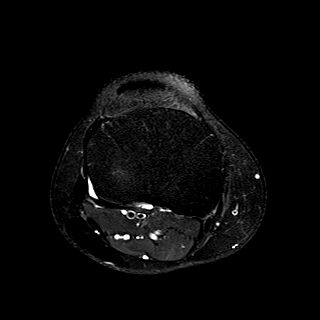
[im 16/26]
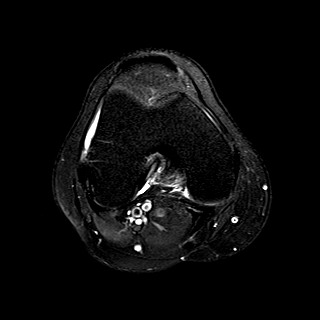
[im 21/26]
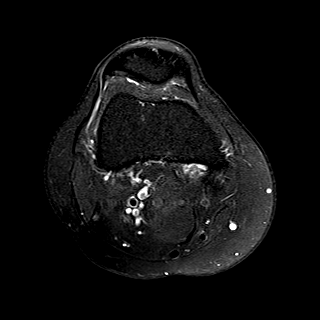
[im 26/26]
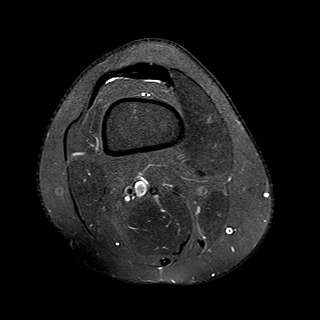

[Series 9: T1 · coronal · right · 4.0mm · 0.59mm/px · 5 of 22 slices shown]
[im 1/22]
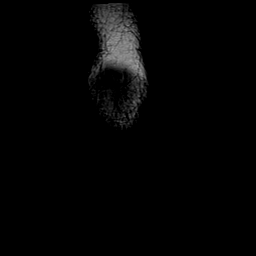
[im 6/22]
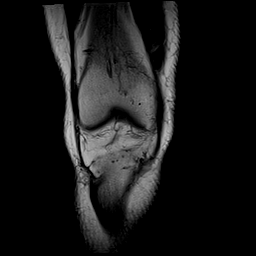
[im 11/22]
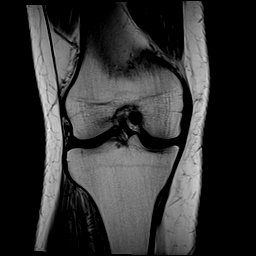
[im 16/22]
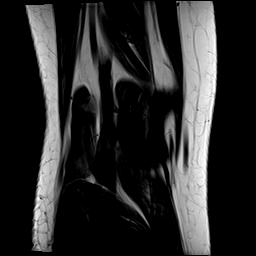
[im 22/22]
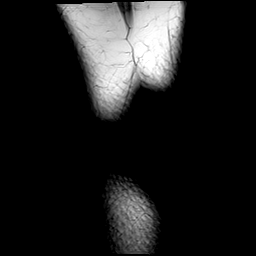

[Series 10: T2 fat-sat · coronal · right · 4.0mm · 0.59mm/px · 5 of 22 slices shown (2 of 3)]
[im 1/22]
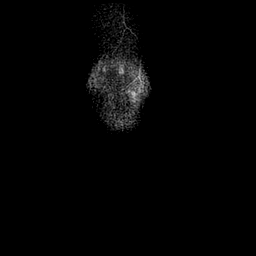
[im 6/22]
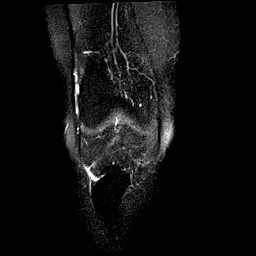
[im 11/22]
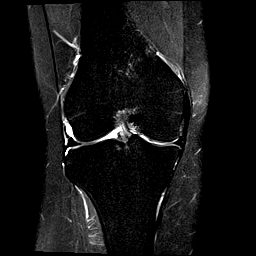
[im 16/22]
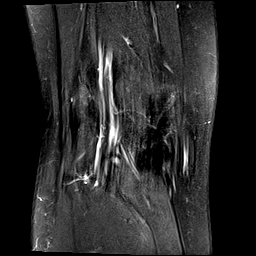
[im 22/22]
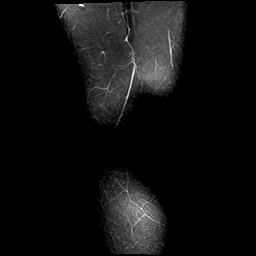

[Series 11: PD fat-sat · coronal · right · 3.0mm · 0.59mm/px · 7 of 30 slices shown (1 of 2)]
[im 1/30]
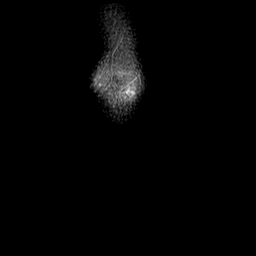
[im 5/30]
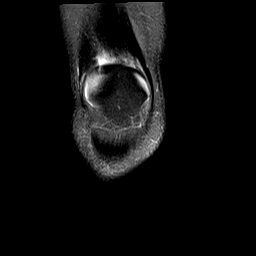
[im 10/30]
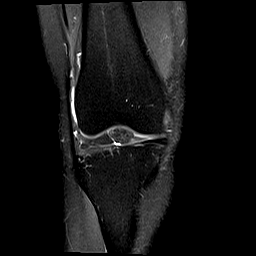
[im 15/30]
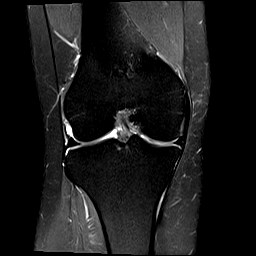
[im 20/30]
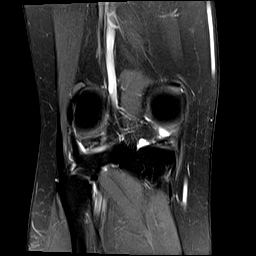
[im 25/30]
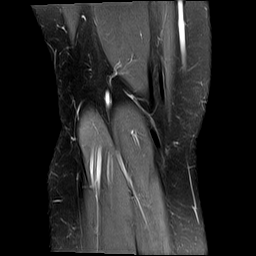
[im 30/30]
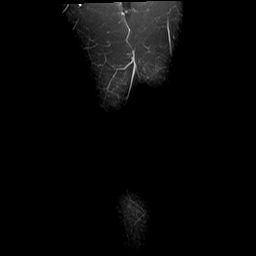

[Series 12: PD fat-sat · sagittal · right · 3.0mm · 0.59mm/px · 6 of 26 slices shown (2 of 2)]
[im 1/26]
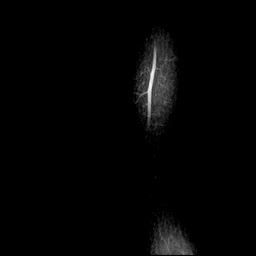
[im 6/26]
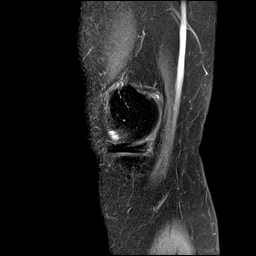
[im 11/26]
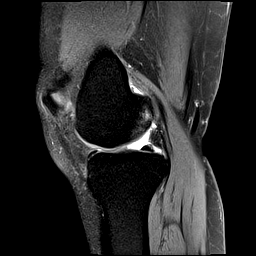
[im 16/26]
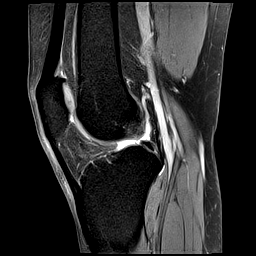
[im 21/26]
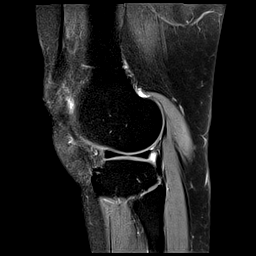
[im 26/26]
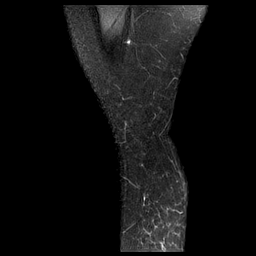

[Series 13: T2 fat-sat · sagittal · right · 3.0mm · 0.59mm/px · 6 of 26 slices shown (3 of 3)]
[im 1/26]
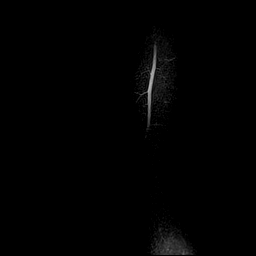
[im 6/26]
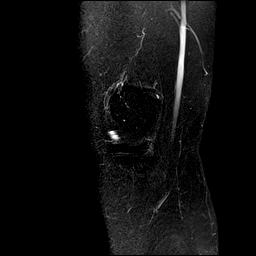
[im 11/26]
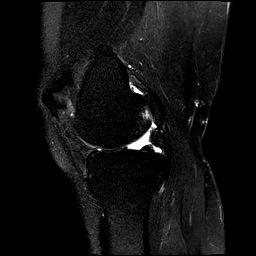
[im 16/26]
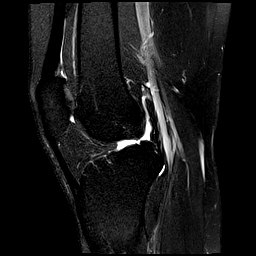
[im 21/26]
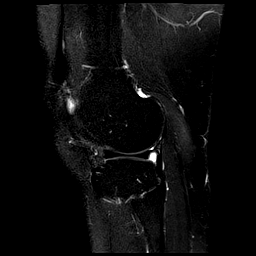
[im 26/26]
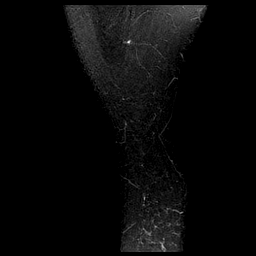

[Series 14: PD · coronal · right · 2.0mm · 0.47mm/px · 5 of 20 slices shown]
[im 1/20]
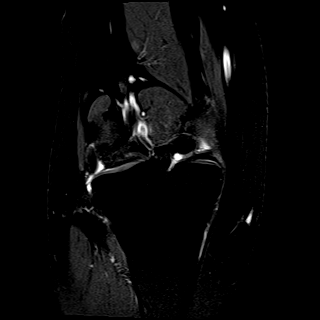
[im 5/20]
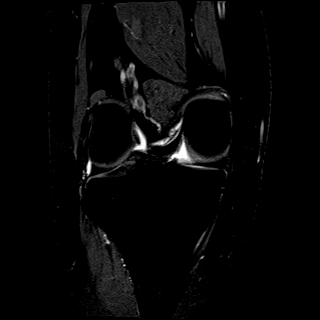
[im 10/20]
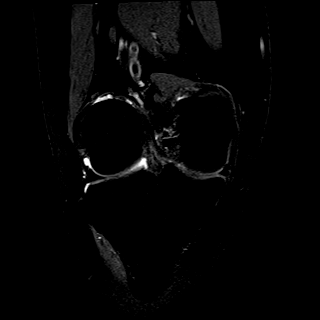
[im 15/20]
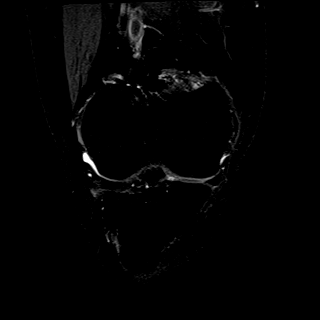
[im 20/20]
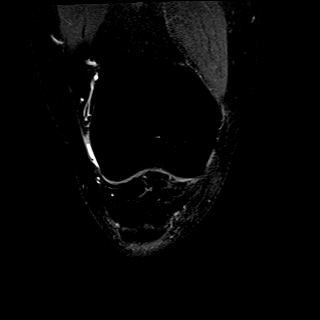

[40 of 40 positions shown; findings below may reference images not displayed]

FINDINGS: MENISCI

Medial meniscus:  Intact.

Lateral meniscus:  Intact.

LIGAMENTS

Cruciates:  Intact ACL and PCL.

Collaterals: Medial collateral ligament is intact. Lateral
collateral ligament complex is intact.

CARTILAGE

Patellofemoral:  No chondral defect.

Medial: Focal partial-thickness cartilage defect medial femoral
condyle along its lateral aspect measuring up to 6 mm (series 13,
image 17). No cartilage abnormality medial tibial plateau.

Lateral:  No chondral defect.

Joint: Physiologic amount of joint fluid without effusion. Fat pads
within normal limits.

Popliteal Fossa:  No Baker cyst. Intact popliteus tendon.

Extensor Mechanism:  Intact quadriceps tendon and patellar tendon.

Bones: No focal marrow signal abnormality. No fracture or
dislocation.

Other: None.
IMPRESSION: 1. Focal partial-thickness cartilage defect of the medial femoral
condyle measuring up to 6 mm.
2. Intact menisci. Intact cruciate and collateral ligaments.

## 2022-11-26 ENCOUNTER — Emergency Department (HOSPITAL_BASED_OUTPATIENT_CLINIC_OR_DEPARTMENT_OTHER)
Admission: EM | Admit: 2022-11-26 | Discharge: 2022-11-26 | Discharge status: Against Medical Advice | Payer: Self-pay | Attending: Emergency Medicine | Admitting: Emergency Medicine

## 2022-11-26 ENCOUNTER — Encounter (HOSPITAL_BASED_OUTPATIENT_CLINIC_OR_DEPARTMENT_OTHER): Payer: Self-pay | Admitting: Emergency Medicine

## 2022-11-26 ENCOUNTER — Other Ambulatory Visit: Payer: Self-pay

## 2022-11-26 DIAGNOSIS — Z5321 Procedure and treatment not carried out due to patient leaving prior to being seen by health care provider: Secondary | ICD-10-CM | POA: Insufficient documentation

## 2022-11-26 DIAGNOSIS — R112 Nausea with vomiting, unspecified: Secondary | ICD-10-CM | POA: Insufficient documentation

## 2022-11-26 DIAGNOSIS — R519 Headache, unspecified: Secondary | ICD-10-CM | POA: Insufficient documentation

## 2022-11-26 NOTE — ED Notes (Signed)
Late entry -- PA-C Britni advises pt exited room and stated they needed to leave due to emergency; this nurse did not personally observe pt exit building as nurse was with other patient.

## 2022-11-26 NOTE — ED Triage Notes (Signed)
Pt presents to ED POV. Pt c/o HA that began this weekend. Pt reports that pain is frontal and c/o n/v.

## 2023-06-19 ENCOUNTER — Encounter: Payer: Self-pay | Admitting: Plastic Surgery
